# Patient Record
Sex: Female | Born: 1990 | Race: Black or African American | Hispanic: No | Marital: Single | Smoking: Former smoker
Health system: Southern US, Community
[De-identification: ages and names within clinical notes are randomized; demographics above are authoritative.]

## PROBLEM LIST (undated history)

## (undated) DIAGNOSIS — D649 Anemia, unspecified: Secondary | ICD-10-CM

## (undated) DIAGNOSIS — O24419 Gestational diabetes mellitus in pregnancy, unspecified control: Secondary | ICD-10-CM

## (undated) HISTORY — DX: Gestational diabetes mellitus in pregnancy, unspecified control: O24.419

---

## 2011-09-27 HISTORY — PX: DILATION AND CURETTAGE OF UTERUS: SHX78

## 2013-01-10 ENCOUNTER — Emergency Department: Payer: Self-pay | Admitting: Emergency Medicine

## 2019-02-18 ENCOUNTER — Encounter: Payer: Self-pay | Admitting: Emergency Medicine

## 2019-02-18 ENCOUNTER — Other Ambulatory Visit: Payer: Self-pay

## 2019-02-18 ENCOUNTER — Ambulatory Visit
Admission: EM | Admit: 2019-02-18 | Discharge: 2019-02-18 | Disposition: A | Discharge status: Home / Self Care | Payer: Medicaid Other | Attending: Urgent Care | Admitting: Urgent Care

## 2019-02-18 DIAGNOSIS — Z3A01 Less than 8 weeks gestation of pregnancy: Secondary | ICD-10-CM

## 2019-02-18 DIAGNOSIS — O210 Mild hyperemesis gravidarum: Secondary | ICD-10-CM

## 2019-02-18 LAB — PREGNANCY, URINE: Preg Test, Ur: POSITIVE — AB

## 2019-02-18 MED ORDER — ONDANSETRON 4 MG PO TBDP: 4.0000 mg | ORAL_TABLET | Freq: Three times a day (TID) | ORAL | 0 refills | Status: DC | PRN

## 2019-02-18 MED ORDER — ONDANSETRON 8 MG PO TBDP
8.0000 mg | ORAL_TABLET | Freq: Once | ORAL | Status: AC
  Administered 2019-02-18: 12:00:00 8 mg via ORAL

## 2019-02-18 NOTE — Discharge Instructions (Signed)
It was very nice meeting you today in clinic. Thank you for entrusting me with your care.   As discussed, your urine pregnancy test was POSITIVE. You were not having pain or bleeding at time of your visit today. You will need to establish care with OB/GYN to determine fetal age and estimated due dates. I have given you the name of 3 clinics above to call for an appointment. One of them should be able to get you in soon.   Please utilize the medications that we discussed. Your prescriptions have been called in to your pharmacy.  Increase fluid intake as much as possible. Water is always best, as sugar and caffeine containing fluids can cause you to become dehydrated. Try to incorporate electrolyte enriched fluids, such as Gatorade or Pedialyte, into your daily fluid intake.   If your symptoms/condition worsens, please seek follow up care either here or in the ER. Please remember, our [facility] providers are "right here with you" when you need [place].   Again, it was my pleasure to take care of you today. Thank you for choosing our clinic. I hope that you start to feel better quickly.   Quentin Mulling, MSN, APRN, FNP-C, CEN Advanced Practice Provider [facility]

## 2019-02-18 NOTE — ED Provider Notes (Signed)
[address], [address] [place], [place] [address] [phone]   Name: Tammy Townsend DOB: [birth year] MRN: [id number] CSN: [id number] PCP: Patient, No Pcp Per  Arrival date and time:  02/18/19 1117  Chief Complaint:  Emesis; Nausea; and Amenorrhea  NOTE: Prior to seeing the patient today, I have reviewed the triage nursing documentation and vital signs. Clinical staff has updated patient's PMH/PSHx, current medication list, and drug allergies/intolerances to ensure comprehensive history available to assist in medical decision making.   History:   HPI: Tammy Townsend is a 28 y.o. female who presents today with complaints of nausea and vomiting that began 3 days ago. No diarrhea or fevers. Symptoms are in the context of a (+) home urine HCG test. LMP was 01/04/2019 - 01/09/2019. Patient notes that "everything" makes her sick. She states, "I am been trying to eat bland stuff and drink ginger ale, but it is still making me sick. I am so hungry that the hunger is making me sick. Smells are the worst. The least little smell can set me off". Patient feels "weak and dehydrated". She denies abdominal pain, vaginal pain, and bleeding. Patient presents to the clinic today actively vomiting. She was medicated with ondansetron 8 mg ODT in the lobby by clinic nursing staff. Repeat urine HCG in the clinic today found to be (+).   Pregnancy history: Approximately 5-6 week G2P0A1s. Has no OB/GYN provider.   History reviewed. No pertinent past medical history.  Past Surgical History:  Procedure Laterality Date   DILATION AND CURETTAGE OF UTERUS  2013   miscarriage    Family History  Problem Relation Age of Onset   Cancer Mother    Diabetes Father    Hypertension Father    Heart failure Father     Social History   Socioeconomic History   Marital status: Single    Spouse name: Not on file   Number of children: Not on file   Years of education: Not on file   Highest education  level: Not on file  Occupational History   Not on file  Social Needs   Financial resource strain: Not on file   Food insecurity:    Worry: Not on file    Inability: Not on file   Transportation needs:    Medical: Not on file    Non-medical: Not on file  Tobacco Use   Smoking status: Former Smoker   Smokeless tobacco: Never Used  Substance and Sexual Activity   Alcohol use: Not Currently   Drug use: Not Currently   Sexual activity: Not on file  Lifestyle   Physical activity:    Days per week: Not on file    Minutes per session: Not on file   Stress: Not on file  Relationships   Social connections:    Talks on phone: Not on file    Gets together: Not on file    Attends religious service: Not on file    Active member of club or organization: Not on file    Attends meetings of clubs or organizations: Not on file    Relationship status: Not on file   Intimate partner violence:    Fear of current or ex partner: Not on file    Emotionally abused: Not on file    Physically abused: Not on file    Forced sexual activity: Not on file  Other Topics Concern   Not on file  Social History Narrative   Not on file  There are no active problems to display for this patient.   Home Medications:    No outpatient medications have been marked as taking for the 02/18/19 encounter [facility] Encounter).    Allergies:   Patient has no known allergies.  Review of Systems (ROS): Review of Systems  Constitutional: Positive for appetite change (decreased 2/2 acute N/V) and fatigue. Negative for chills and fever.  Respiratory: Negative for cough and shortness of breath.   Cardiovascular: Negative for chest pain and palpitations.  Gastrointestinal: Positive for nausea and vomiting. Negative for abdominal pain, constipation and diarrhea.  Genitourinary: Negative for dysuria, flank pain, hematuria, pelvic pain, urgency, vaginal bleeding, vaginal discharge and vaginal pain.    Musculoskeletal: Negative for back pain.     Physical Exam:  Triage Vital Signs ED Triage Vitals  Enc Vitals Group     BP 02/18/19 1150 (!) 145/80     Pulse Rate 02/18/19 1150 68     Resp 02/18/19 1150 18     Temp 02/18/19 1150 98.4 F (36.9 C)     Temp Source 02/18/19 1150 Oral     SpO2 02/18/19 1150 100 %     Weight 02/18/19 1146 245 lb (111.1 kg)     Height 02/18/19 1146  (1.651 m)     Head Circumference --      Peak Flow --      Pain Score 02/18/19 1145 0     Pain Loc --      Pain Edu? --      Excl. in GC? --     Physical Exam  Constitutional: She is oriented to person, place, and time and well-developed, well-nourished, and in no distress.  HENT:  Head: Normocephalic and atraumatic.  Mouth/Throat: Oropharynx is clear and moist and mucous membranes are normal.  Eyes: Pupils are equal, round, and reactive to light. EOM are normal.  Neck: Normal range of motion. Neck supple. No tracheal deviation present.  Cardiovascular: Normal rate, regular rhythm, normal heart sounds and intact distal pulses. Exam reveals no gallop and no friction rub.  No murmur heard. Pulmonary/Chest: Effort normal and breath sounds normal. No respiratory distress. She has no wheezes. She has no rales.  Abdominal: Soft. Bowel sounds are normal. She exhibits no distension. There is no abdominal tenderness.  Musculoskeletal: Normal range of motion.  Lymphadenopathy:    She has no cervical adenopathy.  Neurological: She is alert and oriented to person, place, and time.  Skin: Skin is warm and dry. No rash noted. No erythema.  Psychiatric: Affect and judgment normal. Her mood appears anxious (2/2 acute symptoms).  Nursing note and vitals reviewed.    Urgent Care Treatments / Results:   LABS: PLEASE NOTE: all labs that were ordered this encounter are listed, however only abnormal results are displayed. Labs Reviewed  PREGNANCY, URINE - Abnormal; Notable for the following components:       Result Value   Preg Test, Ur POSITIVE (*)    All other components within normal limits    EKG: -None  RADIOLOGY: No results found.  PRODEDURES: Procedures  MEDICATIONS RECEIVED THIS VISIT: Medications  ondansetron (ZOFRAN-ODT) disintegrating tablet 8 mg (8 mg Oral Given 02/18/19 1142)    PERTINENT CLINICAL COURSE NOTES/UPDATES: No data to display   Initial Impression / Assessment and Plan / Urgent Care Course:    Tammy Townsend is a 28 y.o. female who presents to [facility] today with complaints of Emesis; Nausea; and Amenorrhea  Pertinent labs &  imaging results that were available during my care of the patient were personally reviewed by me and considered in my medical decision making (see lab/imaging section of note for values and interpretations).  Patient presents with nausea and vomiting x 3 days. Urine HCG (+). Medicated with ondansetron while in the lobby, which markedly improved patient's symptoms. She was observed sipping on clear fluids during exam. VSS at time of my assessment; no tachycardia or hypotension. Patient denies abdominal pain and vaginal bleeding. Based on LMP, patient is approximately 5-[redacted] weeks pregnant. In the absence of ultrasound imaging, there is no way to advise on exact fetal age, to determine that she has a viable IUP at this point, or to provide her with an accurate EDC. Discussed that if she were to develop any abdominal/vaginal pain or bleeding, she would need to be seen in the ED for further evaluation and imaging. Will treat patient for hyperemesis gravidarum; ondansetron prescription sent in. Discussed need to increase fluid intake as much as possible. Reviewed that water always a good choice, but encouraged to incorporate electrolyte enriched fluids, such as Gatorade or Pedialyte, into daily fluid intake. Patient provided with practice contact information for the [facility], [facility], and [facility]. She was  advised to make contact tomorrow to schedule an appointment to establish pre-natal case visit.    Again, I have reviewed the follow up and strict return precautions for any new or worsening symptoms. Patient is aware of symptoms that would be deemed urgent/emergent, and would thus require further evaluation either here or in the emergency department. At the time of discharge, she verbalized understanding and consent with the discharge plan as it was reviewed with her. All questions were fielded by provider and/or clinic staff prior to patient discharge.    Final Clinical Impressions(s) / Urgent Care Diagnoses:   Final diagnoses:  Hyperemesis gravidarum  Less than [redacted] weeks gestation of pregnancy    New Prescriptions:   Meds ordered this encounter  Medications   ondansetron (ZOFRAN-ODT) disintegrating tablet 8 mg   ondansetron (ZOFRAN-ODT) 4 MG disintegrating tablet    Sig: Take 1 tablet (4 mg total) by mouth every 8 (eight) hours as needed for nausea or vomiting.    Dispense:  21 tablet    Refill:  0    Controlled Substance Prescriptions:  [place] Controlled Substance Registry consulted? Not Applicable  NOTE: This note was prepared using Dragon dictation software along with smaller phrase technology. Despite my best ability to proofread, there is the potential that transcriptional errors may still occur from this process, and are completely unintentional.     Verlee MonteGray, Toney Difatta E, NP 02/18/19 1240

## 2019-02-18 NOTE — ED Triage Notes (Signed)
Pt c/o nausea, and vomiting. Started about 3 days ago. She has taken a home pregnancy test and was positive. Last menstrual period was 01/04/19. This is her first pregnancy.

## 2019-03-01 ENCOUNTER — Encounter: Payer: Self-pay | Admitting: Obstetrics and Gynecology

## 2019-03-01 ENCOUNTER — Other Ambulatory Visit: Payer: Self-pay | Admitting: Obstetrics and Gynecology

## 2019-03-01 ENCOUNTER — Observation Stay
Admission: AD | Admit: 2019-03-01 | Discharge: 2019-03-03 | Disposition: A | Discharge status: Home / Self Care | Payer: Medicaid Other | Source: Ambulatory Visit | Attending: Obstetrics and Gynecology | Admitting: Obstetrics and Gynecology

## 2019-03-01 DIAGNOSIS — Z79899 Other long term (current) drug therapy: Secondary | ICD-10-CM | POA: Diagnosis not present

## 2019-03-01 DIAGNOSIS — Z1159 Encounter for screening for other viral diseases: Secondary | ICD-10-CM | POA: Insufficient documentation

## 2019-03-01 DIAGNOSIS — Z87891 Personal history of nicotine dependence: Secondary | ICD-10-CM | POA: Insufficient documentation

## 2019-03-01 DIAGNOSIS — Z8249 Family history of ischemic heart disease and other diseases of the circulatory system: Secondary | ICD-10-CM | POA: Insufficient documentation

## 2019-03-01 DIAGNOSIS — Z3A08 8 weeks gestation of pregnancy: Secondary | ICD-10-CM | POA: Diagnosis not present

## 2019-03-01 DIAGNOSIS — O211 Hyperemesis gravidarum with metabolic disturbance: Principal | ICD-10-CM | POA: Insufficient documentation

## 2019-03-01 DIAGNOSIS — O210 Mild hyperemesis gravidarum: Secondary | ICD-10-CM | POA: Diagnosis present

## 2019-03-01 HISTORY — DX: Anemia, unspecified: D64.9

## 2019-03-01 LAB — COMPREHENSIVE METABOLIC PANEL
ALT: 49 U/L — ABNORMAL HIGH (ref 0–44)
AST: 29 U/L (ref 15–41)
Albumin: 3.7 g/dL (ref 3.5–5.0)
Alkaline Phosphatase: 32 U/L — ABNORMAL LOW (ref 38–126)
Anion gap: 9 (ref 5–15)
BUN: <5 mg/dL — ABNORMAL LOW (ref 6–20)
CO2: 23 mmol/L (ref 22–32)
Calcium: 8.8 mg/dL — ABNORMAL LOW (ref 8.9–10.3)
Chloride: 103 mmol/L (ref 98–111)
Creatinine, Ser: 0.54 mg/dL (ref 0.44–1.00)
GFR calc Af Amer: >60 mL/min (ref >60)
GFR calc non Af Amer: >60 mL/min (ref >60)
Glucose, Bld: 111 mg/dL — ABNORMAL HIGH (ref 70–99)
Potassium: 3.2 mmol/L — ABNORMAL LOW (ref 3.5–5.1)
Sodium: 135 mmol/L (ref 135–145)
Total Bilirubin: 0.9 mg/dL (ref 0.3–1.2)
Total Protein: 7.2 g/dL (ref 6.5–8.1)

## 2019-03-01 LAB — TYPE AND SCREEN
ABO/RH(D): B POS
Antibody Screen: NEGATIVE

## 2019-03-01 LAB — URINALYSIS, ROUTINE W REFLEX MICROSCOPIC
Bacteria, UA: NONE SEEN
Glucose, UA: NEGATIVE mg/dL
Hgb urine dipstick: NEGATIVE
Ketones, ur: 80 mg/dL — AB
Leukocytes,Ua: NEGATIVE
Nitrite: NEGATIVE
Protein, ur: >=300 mg/dL — AB
Specific Gravity, Urine: 1.034 — ABNORMAL HIGH (ref 1.005–1.030)
pH: 6 (ref 5.0–8.0)

## 2019-03-01 LAB — CBC
HCT: 33.8 % — ABNORMAL LOW (ref 36.0–46.0)
Hemoglobin: 11.1 g/dL — ABNORMAL LOW (ref 12.0–15.0)
MCH: 30.6 pg (ref 26.0–34.0)
MCHC: 32.8 g/dL (ref 30.0–36.0)
MCV: 93.1 fL (ref 80.0–100.0)
Platelets: 301 10*3/uL (ref 150–400)
RBC: 3.63 MIL/uL — ABNORMAL LOW (ref 3.87–5.11)
RDW: 12.6 % (ref 11.5–15.5)
WBC: 11.3 10*3/uL — ABNORMAL HIGH (ref 4.0–10.5)
nRBC: 0 % (ref 0.0–0.2)

## 2019-03-01 MED ORDER — CALCIUM CARBONATE ANTACID 500 MG PO CHEW: 2.0000 | CHEWABLE_TABLET | ORAL | Status: DC | PRN

## 2019-03-01 MED ORDER — ACETAMINOPHEN 325 MG PO TABS: 650.0000 mg | ORAL_TABLET | ORAL | Status: DC | PRN

## 2019-03-01 MED ORDER — PROMETHAZINE HCL 25 MG/ML IJ SOLN: 25.0000 mg | Freq: Four times a day (QID) | INTRAMUSCULAR | Status: DC | PRN

## 2019-03-01 MED ORDER — PROMETHAZINE HCL 25 MG/ML IJ SOLN
25.0000 mg | INTRAMUSCULAR | Status: DC | PRN
  Administered 2019-03-01 – 2019-03-02 (×3): 25 mg via INTRAVENOUS
  Filled 2019-03-01 (×3): qty 1

## 2019-03-01 MED ORDER — POTASSIUM CHLORIDE CRYS ER 20 MEQ PO TBCR
20.0000 meq | EXTENDED_RELEASE_TABLET | Freq: Two times a day (BID) | ORAL | Status: DC
  Administered 2019-03-01: 20 meq via ORAL
  Filled 2019-03-01 (×3): qty 1

## 2019-03-01 MED ORDER — GENERIC EXTERNAL MEDICATION
25.00 | Status: DC
Start: 2019-03-01 — End: 2019-03-01

## 2019-03-01 MED ORDER — ZOLPIDEM TARTRATE 5 MG PO TABS: 5.0000 mg | ORAL_TABLET | Freq: Every evening | ORAL | Status: DC | PRN

## 2019-03-01 MED ORDER — DEXTROSE IN LACTATED RINGERS 5 % IV SOLN
INTRAVENOUS | Status: DC
  Administered 2019-03-01 – 2019-03-02 (×2): via INTRAVENOUS

## 2019-03-01 MED ORDER — DOCUSATE SODIUM 100 MG PO CAPS: 100.0000 mg | ORAL_CAPSULE | Freq: Every day | ORAL | Status: DC

## 2019-03-01 MED ORDER — VITAMIN B-6 50 MG PO TABS
25.0000 mg | ORAL_TABLET | Freq: Three times a day (TID) | ORAL | Status: DC | PRN
  Administered 2019-03-03: 25 mg via ORAL
  Filled 2019-03-01 (×2): qty 0.5

## 2019-03-01 NOTE — H&P (Signed)
Tammy Townsend is a 28 y.o. female. She is at [redacted]w[redacted]d. Patient's last menstrual period was 01/03/2019.   Estimated Date of Delivery: 10/10/2019  Prenatal care site: [facility]   Current pregnancy complicated by:  1. Severe intractable N/V x 3 weeks- [facility]   Chief complaint: weakness and unable to tolerate PO intake  Location: nausea and vomiting Onset/timing: about 3 weeks ago Duration: constant Quality: unable to tolerate any PO intake without emesis Severity: n/a Aggravating or alleviating conditions: pregnancy, has tried Diclegis and other PO meds given by ER.  Associated signs/symptoms: weakness, palpitations Context: seen in office today, given IM phenergan 25mg  around 1630  S: Resting comfortably. no CTX, no VB.no LOF. + nausea, + vomiting and dry heaves- improved since IM medication given in office. Denies: HA, visual changes, SOB, or RUQ/epigastric pain  Maternal Medical History:   Past Medical History:  Diagnosis Date  . Anemia     Past Surgical History:  Procedure Laterality Date  . DILATION AND CURETTAGE OF UTERUS  2013   miscarriage    No Known Allergies  Prior to Admission medications   Medication Sig Start Date End Date Taking? Authorizing Provider  ondansetron (ZOFRAN-ODT) 4 MG disintegrating tablet Take 1 tablet (4 mg total) by mouth every 8 (eight) hours as needed for nausea or vomiting. 02/18/19   [name], NP      Social History: She  reports that she has quit smoking. She has never used smokeless tobacco. She reports previous alcohol use. She reports previous drug use.  Family History: family history includes Cancer in her mother; Diabetes in her father; Heart failure in her father; Hypertension in her father.   Review of Systems: A full review of systems was performed and negative except as noted in the HPI.     O:  BP (!) 151/71 (BP Location: Right Arm) Comment: nurse Kelvin Cellar notified  Pulse 68    Temp 98.4 F (36.9 C) (Oral)   Resp 18   LMP 01/03/2019   SpO2 100% Comment: Room Air No results found for this or any previous visit (from the past 48 hour(s)).   Constitutional: NAD, AAOx3  HE/ENT: extraocular movements grossly intact, moist mucous membranes CV: RRR PULM: nl respiratory effort, CTABL     Abd:  non-tender, non-distended, soft      Ext: Non-tender, Nonedematous   Psych: mood appropriate, speech normal Pelvic: deferred  1st tri transvaginal OB US done in office today:  Uterus anteverted Single, viable IUP, S=[redacted]w[redacted]d FHR=170bpm Yolk sac and amnion imaged Cervical length=4.43cm No free fluid seen Rt complex ovarian cyst=2.3cm    A/P: 28 y.o. G2 P0010 at [redacted]w[redacted]d here for Hyperemesis  Principle Diagnosis:  Hyperemesis Gravidarum   IV fluids: D5LR at 140ml/hr  Phenergan IV 25mg  q4hr prn  B6 25mg  PO TID  Clear liquids as tolerated  Labs pending: UA with culture, CMP, type and screen, CBC   Prudencio Pair Corinn Stoltzfus, CNM 03/01/2019  8:25 PM

## 2019-03-02 DIAGNOSIS — O211 Hyperemesis gravidarum with metabolic disturbance: Secondary | ICD-10-CM | POA: Diagnosis not present

## 2019-03-02 LAB — SARS CORONAVIRUS 2 BY RT PCR (HOSPITAL ORDER, PERFORMED IN ~~LOC~~ HOSPITAL LAB): SARS Coronavirus 2: NEGATIVE

## 2019-03-02 MED ORDER — PROMETHAZINE HCL 25 MG PO TABS
25.0000 mg | ORAL_TABLET | ORAL | Status: DC | PRN
  Administered 2019-03-02 – 2019-03-03 (×4): 25 mg via ORAL
  Filled 2019-03-02 (×6): qty 1

## 2019-03-02 MED ORDER — SODIUM CHLORIDE (PF) 0.9 % IJ SOLN
INTRAMUSCULAR | Status: AC
  Administered 2019-03-02: 14:00:00 10 mL
  Filled 2019-03-02: qty 10

## 2019-03-02 MED ORDER — PROMETHAZINE HCL 25 MG/ML IJ SOLN
25.0000 mg | INTRAMUSCULAR | Status: DC | PRN
  Administered 2019-03-02 (×2): 25 mg via INTRAVENOUS
  Filled 2019-03-02 (×2): qty 1

## 2019-03-02 MED ORDER — SUCRALFATE 1 GM/10ML PO SUSP
1.0000 g | Freq: Three times a day (TID) | ORAL | Status: DC
  Administered 2019-03-02 – 2019-03-03 (×4): 1 g via ORAL
  Filled 2019-03-02 (×7): qty 10

## 2019-03-02 MED ORDER — KCL-LACTATED RINGERS-D5W 20 MEQ/L IV SOLN
INTRAVENOUS | Status: DC
  Administered 2019-03-02 – 2019-03-03 (×3): via INTRAVENOUS
  Filled 2019-03-02 (×8): qty 1000

## 2019-03-02 MED ORDER — PROMETHAZINE HCL 25 MG/ML IJ SOLN
25.0000 mg | INTRAMUSCULAR | Status: DC
  Administered 2019-03-02: 09:00:00 25 mg via INTRAVENOUS
  Filled 2019-03-02: qty 1

## 2019-03-02 MED ORDER — THIAMINE HCL 100 MG/ML IJ SOLN
Freq: Once | INTRAVENOUS | Status: AC
  Administered 2019-03-02: 09:00:00 via INTRAVENOUS
  Filled 2019-03-02: qty 1000

## 2019-03-02 MED ORDER — SODIUM CHLORIDE (PF) 0.9 % IJ SOLN
INTRAMUSCULAR | Status: AC
  Administered 2019-03-02: 09:00:00 10 mL
  Filled 2019-03-02: qty 10

## 2019-03-02 NOTE — Progress Notes (Signed)
ANTEPARTUM PROGRESS NOTE  Tammy Townsend is a 28 y.o. G2P0010 at 6751w2d with Estimated Date of Delivery: 10/10/19 who is admitted for Hyperemesis  Length of Stay:  0 Days. Admitted 03/01/2019  Subjective:  Feeling hungry now, last vomiting at 0250 this morning. Pt feels vomiting was due to taking PO KCL.  She reports no uterine contractions, no bleeding and no loss of fluid per vagina. Feeling much better today, not as weak.   Vitals:  BP 134/60 (BP Location: Right Arm)   Pulse 79   Temp 99.1 F (37.3 C) (Oral)   Resp 18   LMP 01/03/2019   SpO2 100% Comment: Room Air  Physical Examination: CONSTITUTIONAL: Well-developed, well-nourished female in no acute distress.  HENT:  Normocephalic, atraumatic, Oropharynx is clear and moist EYES: Conjunctivae and EOM are normal. Pupils are equal, round, and reactive to light. No scleral icterus.  NECK: Normal range of motion, supple, no masses SKIN: Skin is warm and dry. No rash noted. Not diaphoretic. No erythema. No pallor. NEUROLGIC: Alert and oriented to person, place, and time.   PSYCHIATRIC: Normal mood and affect. Normal behavior. Normal judgment and thought content. CARDIOVASCULAR: Normal heart rate noted, regular rhythm RESPIRATORY: Effort normal, no problems with respiration noted MUSCULOSKELETAL: Normal range of motion. No edema and no tenderness. 2+ distal pulses. ABDOMEN: Soft, nontender, nondistended, gravid. CERVIX:  deferred  Results for orders placed or performed during the hospital encounter of 03/01/19 (from the past 48 hour(s))  Urinalysis, Routine w reflex microscopic     Status: Abnormal   Collection Time: 03/01/19  7:34 PM  Result Value Ref Range   Color, Urine AMBER (A) YELLOW    Comment: BIOCHEMICALS MAY BE AFFECTED BY COLOR   APPearance HAZY (A) CLEAR   Specific Gravity, Urine 1.034 (H) 1.005 - 1.030   pH 6.0 5.0 - 8.0   Glucose, UA NEGATIVE NEGATIVE mg/dL   Hgb urine dipstick NEGATIVE NEGATIVE   Bilirubin Urine  SMALL (A) NEGATIVE   Ketones, ur 80 (A) NEGATIVE mg/dL   Protein, ur >=829>=300 (A) NEGATIVE mg/dL   Nitrite NEGATIVE NEGATIVE   Leukocytes,Ua NEGATIVE NEGATIVE   RBC / HPF 0-5 0 - 5 RBC/hpf   WBC, UA 0-5 0 - 5 WBC/hpf   Bacteria, UA NONE SEEN NONE SEEN   Squamous Epithelial / LPF 6-10 0 - 5   Mucus PRESENT    Ca Oxalate Crys, UA PRESENT     Comment: Performed at [facility], [address] [place], [place] [address]  CBC     Status: Abnormal   Collection Time: 03/01/19  9:51 PM  Result Value Ref Range   WBC 11.3 (H) 4.0 - 10.5 K/uL   RBC 3.63 (L) 3.87 - 5.11 MIL/uL   Hemoglobin 11.1 (L) 12.0 - 15.0 g/dL   HCT 08.633.8 (L) 57.836.0 - 46.946.0 %   MCV 93.1 80.0 - 100.0 fL   MCH 30.6 26.0 - 34.0 pg   MCHC 32.8 30.0 - 36.0 g/dL   RDW 62.912.6 52.811.5 - 41.315.5 %   Platelets 301 150 - 400 K/uL   nRBC 0.0 0.0 - 0.2 %    Comment: Performed at [facility], [address] [place], [place] [address]  Comprehensive metabolic panel     Status: Abnormal   Collection Time: 03/01/19  9:51 PM  Result Value Ref Range   Sodium 135 135 - 145 mmol/L   Potassium 3.2 (L) 3.5 - 5.1 mmol/L   Chloride 103 98 - 111  mmol/L   CO2 23 22 - 32 mmol/L   Glucose, Bld 111 (H) 70 - 99 mg/dL   BUN <5 (L) 6 - 20 mg/dL   Creatinine, Ser 0.54 0.44 - 1.00 mg/dL   Calcium 8.8 (L) 8.9 - 10.3 mg/dL   Total Protein 7.2 6.5 - 8.1 g/dL   Albumin 3.7 3.5 - 5.0 g/dL   AST 29 15 - 41 U/L   ALT 49 (H) 0 - 44 U/L   Alkaline Phosphatase 32 (L) 38 - 126 U/L   Total Bilirubin 0.9 0.3 - 1.2 mg/dL   GFR calc non Af Amer >60 >60 mL/min   GFR calc Af Amer >60 >60 mL/min   Anion gap 9 5 - 15    Comment: Performed at [facility], [address] [place], [place] [address]  Type and screen Thomas     Status: None   Collection Time: 03/01/19  9:51 PM  Result Value Ref Range   ABO/RH(D) B POS    Antibody Screen NEG    Sample Expiration      03/04/2019,2359 Performed at [facility], [address], [place], [place] [address]   SARS Coronavirus 2 (CEPHEID - Performed in [facility] hospital lab), Hosp Order     Status: None   Collection Time: 03/02/19 10:18 AM  Result Value Ref Range   SARS Coronavirus 2 NEGATIVE NEGATIVE    Comment: (NOTE) If result is NEGATIVE SARS-CoV-2 target nucleic acids are NOT DETECTED. The SARS-CoV-2 RNA is generally detectable in upper and lower  respiratory specimens during the acute phase of infection. The lowest  concentration of SARS-CoV-2 viral copies this assay can detect is 250  copies / mL. A negative result does not preclude SARS-CoV-2 infection  and should not be used as the sole basis for treatment or other  patient management decisions.  A negative result may occur with  improper specimen collection / handling, submission of specimen other  than nasopharyngeal swab, presence of viral mutation(s) within the  areas targeted by this assay, and inadequate number of viral copies  (<250 copies / mL). A negative result must be combined with clinical  observations, patient history, and epidemiological information. If result is POSITIVE SARS-CoV-2 target nucleic acids are DETECTED. The SARS-CoV-2 RNA is generally detectable in upper and lower  respiratory specimens dur ing the acute phase of infection.  Positive  results are indicative of active infection with SARS-CoV-2.  Clinical  correlation with patient history and other diagnostic information is  necessary to determine patient infection status.  Positive results do  not rule out bacterial infection or co-infection with other viruses. If result is PRESUMPTIVE POSTIVE SARS-CoV-2 nucleic acids MAY BE PRESENT.   A presumptive positive result was obtained on the submitted specimen  and confirmed on repeat testing.  While 2019 novel coronavirus  (SARS-CoV-2) nucleic acids may be present in the submitted sample  additional confirmatory testing may be necessary for  epidemiological  and / or clinical management purposes  to differentiate between  SARS-CoV-2 and other Sarbecovirus currently known to infect humans.  If clinically indicated additional testing with an alternate test  methodology [phone]) is advised. The SARS-CoV-2 RNA is generally  detectable in upper and lower respiratory sp ecimens during the acute  phase of infection. The expected result is Negative. Fact Sheet for Patients:  StrictlyIdeas.no Fact Sheet for Healthcare Providers: BankingDealers.co.za This test is not yet approved or cleared by the [place] FDA and  has been authorized for detection and/or diagnosis of SARS-CoV-2 by FDA under an Emergency Use Authorization (EUA).  This EUA will remain in effect (meaning this test can be used) for the duration of the COVID-19 declaration under Section 564(b)(1) of the Act, 21 U.S.C. section 360bbb-3(b)(1), unless the authorization is terminated or revoked sooner. Performed at [facility], [address] [place], [place] [address]     No results found.  Current scheduled medications . docusate sodium  100 mg Oral Daily  . potassium chloride  20 mEq Oral BID  . sucralfate  1 g Oral TID WC & HS    I have reviewed the patient's current medications.  ASSESSMENT: Patient Active Problem List   Diagnosis Date Noted  . Hyperemesis gravidarum 03/01/2019    PLAN: - Continue routine antenatal care. - continue IV D5LR with 20KCL/liter added; to start after banana bag complete.  - Continue q4h phenergan, will trial PO.  - Pt wishes to try advancing diet, has been tolerating clears well.  - Pt c/o sore throat and heartburn after having several weeks of vomiting, Carafate suspension ordered with meals and qHS.    Randa NgoRebecca A Lunna Vogelgesang, CNM 03/02/2019  1:05 PM

## 2019-03-02 NOTE — Progress Notes (Signed)
VSS.  Pt states that she feels much better than when she was admitted. Tolerated graham crackers, sweet potato, and some clear liquids today.  Patient was willing to try PO Phenergan instead of IV this evening, and has done well so far.  No emesis today.  Pt denies any pain. Reed Breech, RN 03/02/2019 7:35 PM

## 2019-03-03 DIAGNOSIS — O211 Hyperemesis gravidarum with metabolic disturbance: Secondary | ICD-10-CM | POA: Diagnosis not present

## 2019-03-03 LAB — COMPREHENSIVE METABOLIC PANEL
ALT: 71 U/L — ABNORMAL HIGH (ref 0–44)
AST: 32 U/L (ref 15–41)
Albumin: 3.1 g/dL — ABNORMAL LOW (ref 3.5–5.0)
Alkaline Phosphatase: 30 U/L — ABNORMAL LOW (ref 38–126)
Anion gap: 6 (ref 5–15)
BUN: <5 mg/dL — ABNORMAL LOW (ref 6–20)
CO2: 22 mmol/L (ref 22–32)
Calcium: 8.5 mg/dL — ABNORMAL LOW (ref 8.9–10.3)
Chloride: 108 mmol/L (ref 98–111)
Creatinine, Ser: 0.44 mg/dL (ref 0.44–1.00)
GFR calc Af Amer: >60 mL/min (ref >60)
GFR calc non Af Amer: >60 mL/min (ref >60)
Glucose, Bld: 123 mg/dL — ABNORMAL HIGH (ref 70–99)
Potassium: 3.5 mmol/L (ref 3.5–5.1)
Sodium: 136 mmol/L (ref 135–145)
Total Bilirubin: 0.5 mg/dL (ref 0.3–1.2)
Total Protein: 6.3 g/dL — ABNORMAL LOW (ref 6.5–8.1)

## 2019-03-03 LAB — CBC WITH DIFFERENTIAL/PLATELET
Abs Immature Granulocytes: 0.03 10*3/uL (ref 0.00–0.07)
Basophils Absolute: 0 10*3/uL (ref 0.0–0.1)
Basophils Relative: 0 %
Eosinophils Absolute: 0.1 10*3/uL (ref 0.0–0.5)
Eosinophils Relative: 2 %
HCT: 31.7 % — ABNORMAL LOW (ref 36.0–46.0)
Hemoglobin: 10.2 g/dL — ABNORMAL LOW (ref 12.0–15.0)
Immature Granulocytes: 0 %
Lymphocytes Relative: 32 %
Lymphs Abs: 2.3 10*3/uL (ref 0.7–4.0)
MCH: 30.6 pg (ref 26.0–34.0)
MCHC: 32.2 g/dL (ref 30.0–36.0)
MCV: 95.2 fL (ref 80.0–100.0)
Monocytes Absolute: 0.6 10*3/uL (ref 0.1–1.0)
Monocytes Relative: 8 %
Neutro Abs: 4.2 10*3/uL (ref 1.7–7.7)
Neutrophils Relative %: 58 %
Platelets: 284 10*3/uL (ref 150–400)
RBC: 3.33 MIL/uL — ABNORMAL LOW (ref 3.87–5.11)
RDW: 12.7 % (ref 11.5–15.5)
WBC: 7.3 10*3/uL (ref 4.0–10.5)
nRBC: 0 % (ref 0.0–0.2)

## 2019-03-03 LAB — URINE CULTURE: Culture: NO GROWTH

## 2019-03-03 MED ORDER — ACETAMINOPHEN 325 MG PO TABS: 650.0000 mg | ORAL_TABLET | ORAL | Status: AC | PRN

## 2019-03-03 MED ORDER — DOCUSATE SODIUM 100 MG PO CAPS: 100.0000 mg | ORAL_CAPSULE | Freq: Every day | ORAL | 0 refills | Status: AC

## 2019-03-03 MED ORDER — PROMETHAZINE HCL 25 MG RE SUPP: 25.0000 mg | Freq: Four times a day (QID) | RECTAL | 1 refills | Status: DC | PRN

## 2019-03-03 MED ORDER — PYRIDOXINE HCL 25 MG PO TABS: 25.0000 mg | ORAL_TABLET | Freq: Three times a day (TID) | ORAL | 1 refills | Status: DC | PRN

## 2019-03-03 MED ORDER — PROMETHAZINE HCL 25 MG PO TABS: 25.0000 mg | ORAL_TABLET | ORAL | 2 refills | Status: DC | PRN

## 2019-03-03 MED ORDER — SUCRALFATE 1 GM/10ML PO SUSP: 1.0000 g | Freq: Three times a day (TID) | ORAL | 0 refills | Status: DC

## 2019-03-03 MED ORDER — DOXYLAMINE SUCCINATE (SLEEP) 25 MG PO TABS: 12.5000 mg | ORAL_TABLET | Freq: Three times a day (TID) | ORAL | 2 refills | Status: DC

## 2019-03-03 NOTE — Progress Notes (Signed)
Patient discharged to home. Medications/Prescriptions and discharge instructions reviewed with patient who verbalized understanding. Pt discharged with family via W/C. Will schedule follow-up as instructed. 

## 2019-03-03 NOTE — Progress Notes (Signed)
CNM notified of low BP's. No new orders at this time, will be rounding shortly.

## 2019-03-03 NOTE — Discharge Summary (Addendum)
Patient ID: Tammy Townsend MRN: [id number] DOB/AGE: 1990-12-04 28 y.o.  Patient's last menstrual period was 01/03/2019. Estimated Date of Delivery: 10/10/19  Admit date: 03/01/2019 Discharge date: 03/03/2019  Admission Diagnoses: Hyperemesis at [redacted]wks gestation  Discharge Diagnoses: same  Prenatal Procedures: none  Consults: None  Significant Diagnostic Studies:  Results for orders placed or performed during the hospital encounter of 03/01/19 (from the past 168 hour(s))  Culture, Urine   Collection Time: 03/01/19  7:34 PM  Result Value Ref Range   Specimen Description      URINE, RANDOM Performed at [facility], [address] [place], [place] [address]    Special Requests      NONE Performed at [facility], [address] [place], [place] [address]    Culture      NO GROWTH Performed at [facility], [place] [address] [place], [place] [address]    Report Status 03/03/2019 FINAL   Urinalysis, Routine w reflex microscopic   Collection Time: 03/01/19  7:34 PM  Result Value Ref Range   Color, Urine AMBER (A) YELLOW   APPearance HAZY (A) CLEAR   Specific Gravity, Urine 1.034 (H) 1.005 - 1.030   pH 6.0 5.0 - 8.0   Glucose, UA NEGATIVE NEGATIVE mg/dL   Hgb urine dipstick NEGATIVE NEGATIVE   Bilirubin Urine SMALL (A) NEGATIVE   Ketones, ur 80 (A) NEGATIVE mg/dL   Protein, ur >=300 (A) NEGATIVE mg/dL   Nitrite NEGATIVE NEGATIVE   Leukocytes,Ua NEGATIVE NEGATIVE   RBC / HPF 0-5 0 - 5 RBC/hpf   WBC, UA 0-5 0 - 5 WBC/hpf   Bacteria, UA NONE SEEN NONE SEEN   Squamous Epithelial / LPF 6-10 0 - 5   Mucus PRESENT    Ca Oxalate Crys, UA PRESENT   CBC   Collection Time: 03/01/19  9:51 PM  Result Value Ref Range   WBC 11.3 (H) 4.0 - 10.5 K/uL   RBC 3.63 (L) 3.87 - 5.11 MIL/uL   Hemoglobin 11.1 (L) 12.0 - 15.0 g/dL   HCT 33.8 (L) 36.0 - 46.0 %   MCV 93.1 80.0 - 100.0 fL   MCH 30.6 26.0 - 34.0 pg   MCHC 32.8 30.0 - 36.0 g/dL   RDW 12.6 11.5 - 15.5 %    Platelets 301 150 - 400 K/uL   nRBC 0.0 0.0 - 0.2 %  Comprehensive metabolic panel   Collection Time: 03/01/19  9:51 PM  Result Value Ref Range   Sodium 135 135 - 145 mmol/L   Potassium 3.2 (L) 3.5 - 5.1 mmol/L   Chloride 103 98 - 111 mmol/L   CO2 23 22 - 32 mmol/L   Glucose, Bld 111 (H) 70 - 99 mg/dL   BUN <5 (L) 6 - 20 mg/dL   Creatinine, Ser 0.54 0.44 - 1.00 mg/dL   Calcium 8.8 (L) 8.9 - 10.3 mg/dL   Total Protein 7.2 6.5 - 8.1 g/dL   Albumin 3.7 3.5 - 5.0 g/dL   AST 29 15 - 41 U/L   ALT 49 (H) 0 - 44 U/L   Alkaline Phosphatase 32 (L) 38 - 126 U/L   Total Bilirubin 0.9 0.3 - 1.2 mg/dL   GFR calc non Af Amer >60 >60 mL/min   GFR calc Af Amer >60 >60 mL/min   Anion gap 9 5 - 15  Type and screen [place]   Collection Time: 03/01/19  9:51 PM  Result Value Ref Range  ABO/RH(D) B POS    Antibody Screen NEG    Sample Expiration      03/04/2019,2359 Performed at [facility], [address] [place], [place] [address]   SARS Coronavirus 2 (CEPHEID - Performed in [facility]), [facility]   Collection Time: 03/02/19 10:18 AM  Result Value Ref Range   SARS Coronavirus 2 NEGATIVE NEGATIVE  CBC with Differential/Platelet   Collection Time: 03/03/19  5:15 AM  Result Value Ref Range   WBC 7.3 4.0 - 10.5 K/uL   RBC 3.33 (L) 3.87 - 5.11 MIL/uL   Hemoglobin 10.2 (L) 12.0 - 15.0 g/dL   HCT 47.831.7 (L) 29.536.0 - 62.146.0 %   MCV 95.2 80.0 - 100.0 fL   MCH 30.6 26.0 - 34.0 pg   MCHC 32.2 30.0 - 36.0 g/dL   RDW 30.812.7 65.711.5 - 84.615.5 %   Platelets 284 150 - 400 K/uL   nRBC 0.0 0.0 - 0.2 %   Neutrophils Relative % 58 %   Neutro Abs 4.2 1.7 - 7.7 K/uL   Lymphocytes Relative 32 %   Lymphs Abs 2.3 0.7 - 4.0 K/uL   Monocytes Relative 8 %   Monocytes Absolute 0.6 0.1 - 1.0 K/uL   Eosinophils Relative 2 %   Eosinophils Absolute 0.1 0.0 - 0.5 K/uL   Basophils Relative 0 %   Basophils Absolute 0.0 0.0 - 0.1 K/uL   Immature Granulocytes 0 %   Abs Immature  Granulocytes 0.03 0.00 - 0.07 K/uL  Comprehensive metabolic panel   Collection Time: 03/03/19  5:15 AM  Result Value Ref Range   Sodium 136 135 - 145 mmol/L   Potassium 3.5 3.5 - 5.1 mmol/L   Chloride 108 98 - 111 mmol/L   CO2 22 22 - 32 mmol/L   Glucose, Bld 123 (H) 70 - 99 mg/dL   BUN <5 (L) 6 - 20 mg/dL   Creatinine, Ser 9.620.44 0.44 - 1.00 mg/dL   Calcium 8.5 (L) 8.9 - 10.3 mg/dL   Total Protein 6.3 (L) 6.5 - 8.1 g/dL   Albumin 3.1 (L) 3.5 - 5.0 g/dL   AST 32 15 - 41 U/L   ALT 71 (H) 0 - 44 U/L   Alkaline Phosphatase 30 (L) 38 - 126 U/L   Total Bilirubin 0.5 0.3 - 1.2 mg/dL   GFR calc non Af Amer >60 >60 mL/min   GFR calc Af Amer >60 >60 mL/min   Anion gap 6 5 - 15    Treatments: IV hydration and antiemetics  Hospital Course:  This is a 28 y.o. G2P0010 with IUP at 173w3d admitted for Hyperemesis gravidarum. She has had severe intractable N/V x 3 weeks with [facility] She was given IV hydration with D5LR with potassium, and bananabag x 1. Treated acute N/V with IV phenergan then transitioned to PO phenergan. She was able to tolerate regular diet and clear fluids on hospital day 2. DC home meds PO phenergan, vitamin B6 and Unisom, Carafate. She was deemed stable for discharge to home with outpatient follow up.  Discharge Physical Exam:  BP (!) 108/54 (BP Location: Right Arm)   Pulse 79   Temp 98.6 F (37 C) (Oral)   Resp 18   LMP 01/03/2019   SpO2 100% Comment: room air  General: NAD CV: RRR Pulm: CTABL, nl effort ABD: s/nd/nt DVT Evaluation: LE non-ttp, no evidence of DVT on exam.   Discharge Condition: Stable  Disposition: Discharge disposition: 01-Home  or Self Care     Home   Allergies as of 03/03/2019   No Known Allergies     Medication List    STOP taking these medications   ondansetron 4 MG disintegrating tablet Commonly known as:  ZOFRAN-ODT     TAKE these medications   acetaminophen 325 MG tablet Commonly known as:   TYLENOL Take 2 tablets (650 mg total) by mouth every 4 (four) hours as needed (for pain scale < 4  OR  temperature  >/=  100.5 F).   docusate sodium 100 MG capsule Commonly known as:  COLACE Take 1 capsule (100 mg total) by mouth daily. Start taking on:  March 04, 2019   doxylamine (Sleep) 25 MG tablet Commonly known as:  UNISOM Take 0.5 tablets (12.5 mg total) by mouth 3 (three) times daily. Take with vitamin b6   promethazine 25 MG tablet Commonly known as:  PHENERGAN Take 1 tablet (25 mg total) by mouth every 4 (four) hours as needed for nausea or vomiting.   promethazine 25 MG suppository Commonly known as:  Phenergan Place 1 suppository (25 mg total) rectally every 6 (six) hours as needed for nausea.   pyridOXINE 25 MG tablet Commonly known as:  VITAMIN B-6 Take 1 tablet (25 mg total) by mouth 3 (three) times daily as needed (nausea / vomiting).   sucralfate 1 GM/10ML suspension Commonly known as:  CARAFATE Take 10 mLs (1 g total) by mouth 4 (four) times daily -  with meals and at bedtime.      Follow-up Information    Dollene Mallery, Prudencio PairRebecca A, CNM Follow up in 2 week(s).   Specialty:  Obstetrics and Gynecology Why:  New OB appt and f/u labs from hospital Contact information: [address] [place] [address] [phone]           Signed:  Randa NgoRebecca A Zaevion Parke, CNM 03/03/2019  3:23 PM

## 2019-03-03 NOTE — Progress Notes (Signed)
ANTEPARTUM PROGRESS NOTE  Tammy Townsend is a 28 y.o. G2P0010 at 7113w3d with [facility]  10/10/19 who is admitted for Hyperemesis.   Length of Stay:  2 Days. Admitted 03/01/2019  Subjective: Eating light breakfast now, last vomiting around 2200 last night.  She reports no uterine contractions, no bleeding and no loss of fluid per vagina. Feeling much better today, would like to shower.   Vitals:  BP (!) 111/43 (BP Location: Right Arm)   Pulse 61   Temp 97.9 F (36.6 C) (Oral)   Resp 16   LMP 01/03/2019   SpO2 99%   Physical Examination: CONSTITUTIONAL: Well-developed, well-nourished female in no acute distress.  HENT:  Normocephalic, atraumatic, Oropharynx is clear and moist EYES: Conjunctivae and EOM are normal. Pupils are equal, round, and reactive to light. No scleral icterus.  NECK: Normal range of motion, supple, no masses SKIN: Skin is warm and dry. No rash noted. Not diaphoretic. No erythema. No pallor. NEUROLGIC: Alert and oriented to person, place, and time.   PSYCHIATRIC: Normal mood and affect. Normal behavior. Normal judgment and thought content. CARDIOVASCULAR: Normal heart rate noted, regular rhythm RESPIRATORY: Effort normal, no problems with respiration noted MUSCULOSKELETAL: Normal range of motion. No edema and no tenderness. 2+ distal pulses. ABDOMEN: Soft, nontender, nondistended CERVIX:  deferred   Results for orders placed or performed during the hospital encounter of 03/01/19 (from the past 48 hour(s))  Urinalysis, Routine w reflex microscopic     Status: Abnormal   Collection Time: 03/01/19  7:34 PM  Result Value Ref Range   Color, Urine AMBER (A) YELLOW    Comment: BIOCHEMICALS MAY BE AFFECTED BY COLOR   APPearance HAZY (A) CLEAR   Specific Gravity, Urine 1.034 (H) 1.005 - 1.030   pH 6.0 5.0 - 8.0   Glucose, UA NEGATIVE NEGATIVE mg/dL   Hgb urine dipstick NEGATIVE NEGATIVE   Bilirubin Urine SMALL (A) NEGATIVE   Ketones, ur 80 (A) NEGATIVE mg/dL   Protein, ur  >=161>=300 (A) NEGATIVE mg/dL   Nitrite NEGATIVE NEGATIVE   Leukocytes,Ua NEGATIVE NEGATIVE   RBC / HPF 0-5 0 - 5 RBC/hpf   WBC, UA 0-5 0 - 5 WBC/hpf   Bacteria, UA NONE SEEN NONE SEEN   Squamous Epithelial / LPF 6-10 0 - 5   Mucus PRESENT    Ca Oxalate Crys, UA PRESENT     Comment: Performed at [facility], [address] [place], [place] [address]  CBC     Status: Abnormal   Collection Time: 03/01/19  9:51 PM  Result Value Ref Range   WBC 11.3 (H) 4.0 - 10.5 K/uL   RBC 3.63 (L) 3.87 - 5.11 MIL/uL   Hemoglobin 11.1 (L) 12.0 - 15.0 g/dL   HCT 54.033.8 (L) 98.136.0 - 19.146.0 %   MCV 93.1 80.0 - 100.0 fL   MCH 30.6 26.0 - 34.0 pg   MCHC 32.8 30.0 - 36.0 g/dL   RDW 47.812.6 29.511.5 - 62.115.5 %   Platelets 301 150 - 400 K/uL   nRBC 0.0 0.0 - 0.2 %    Comment: Performed at [facility], [address] [place], [place] [address]  Comprehensive metabolic panel     Status: Abnormal   Collection Time: 03/01/19  9:51 PM  Result Value Ref Range   Sodium 135 135 - 145 mmol/L   Potassium 3.2 (L) 3.5 - 5.1 mmol/L   Chloride 103 98 - 111 mmol/L   CO2 23 22 - 32 mmol/L  Glucose, Bld 111 (H) 70 - 99 mg/dL   BUN <5 (L) 6 - 20 mg/dL   Creatinine, Ser 0.54 0.44 - 1.00 mg/dL   Calcium 8.8 (L) 8.9 - 10.3 mg/dL   Total Protein 7.2 6.5 - 8.1 g/dL   Albumin 3.7 3.5 - 5.0 g/dL   AST 29 15 - 41 U/L   ALT 49 (H) 0 - 44 U/L   Alkaline Phosphatase 32 (L) 38 - 126 U/L   Total Bilirubin 0.9 0.3 - 1.2 mg/dL   GFR calc non Af Amer >60 >60 mL/min   GFR calc Af Amer >60 >60 mL/min   Anion gap 9 5 - 15    Comment: Performed at [facility], [address], [place], [place] [address]  Type and screen [place]     Status: None   Collection Time: 03/01/19  9:51 PM  Result Value Ref Range   ABO/RH(D) B POS    Antibody Screen NEG    Sample Expiration      03/04/2019,2359 Performed at [facility], [address] [place], [place] [address]   SARS Coronavirus 2  (CEPHEID - Performed in [place] hospital lab), Hosp Order     Status: None   Collection Time: 03/02/19 10:18 AM  Result Value Ref Range   SARS Coronavirus 2 NEGATIVE NEGATIVE    Comment: (NOTE) If result is NEGATIVE SARS-CoV-2 target nucleic acids are NOT DETECTED. The SARS-CoV-2 RNA is generally detectable in upper and lower  respiratory specimens during the acute phase of infection. The lowest  concentration of SARS-CoV-2 viral copies this assay can detect is 250  copies / mL. A negative result does not preclude SARS-CoV-2 infection  and should not be used as the sole basis for treatment or other  patient management decisions.  A negative result may occur with  improper specimen collection / handling, submission of specimen other  than nasopharyngeal swab, presence of viral mutation(s) within the  areas targeted by this assay, and inadequate number of viral copies  (<250 copies / mL). A negative result must be combined with clinical  observations, patient history, and epidemiological information. If result is POSITIVE SARS-CoV-2 target nucleic acids are DETECTED. The SARS-CoV-2 RNA is generally detectable in upper and lower  respiratory specimens dur ing the acute phase of infection.  Positive  results are indicative of active infection with SARS-CoV-2.  Clinical  correlation with patient history and other diagnostic information is  necessary to determine patient infection status.  Positive results do  not rule out bacterial infection or co-infection with other viruses. If result is PRESUMPTIVE POSTIVE SARS-CoV-2 nucleic acids MAY BE PRESENT.   A presumptive positive result was obtained on the submitted specimen  and confirmed on repeat testing.  While 2019 novel coronavirus  (SARS-CoV-2) nucleic acids may be present in the submitted sample  additional confirmatory testing may be necessary for epidemiological  and / or clinical management purposes  to differentiate between   SARS-CoV-2 and other Sarbecovirus currently known to infect humans.  If clinically indicated additional testing with an alternate test  methodology [phone]) is advised. The SARS-CoV-2 RNA is generally  detectable in upper and lower respiratory sp ecimens during the acute  phase of infection. The expected result is Negative. Fact Sheet for Patients:  StrictlyIdeas.no Fact Sheet for Healthcare Providers: BankingDealers.co.za This test is not yet approved or cleared by the [place] FDA and has been authorized for detection and/or diagnosis of SARS-CoV-2 by FDA  under an Emergency Use Authorization (EUA).  This EUA will remain in effect (meaning this test can be used) for the duration of the COVID-19 declaration under Section 564(b)(1) of the Act, 21 U.S.C. section 360bbb-3(b)(1), unless the authorization is terminated or revoked sooner. Performed at [facility], [address] [place], [place] [address]   CBC with Differential/Platelet     Status: Abnormal   Collection Time: 03/03/19  5:15 AM  Result Value Ref Range   WBC 7.3 4.0 - 10.5 K/uL   RBC 3.33 (L) 3.87 - 5.11 MIL/uL   Hemoglobin 10.2 (L) 12.0 - 15.0 g/dL   HCT 40.931.7 (L) 81.136.0 - 91.446.0 %   MCV 95.2 80.0 - 100.0 fL   MCH 30.6 26.0 - 34.0 pg   MCHC 32.2 30.0 - 36.0 g/dL   RDW 78.212.7 95.611.5 - 21.315.5 %   Platelets 284 150 - 400 K/uL   nRBC 0.0 0.0 - 0.2 %   Neutrophils Relative % 58 %   Neutro Abs 4.2 1.7 - 7.7 K/uL   Lymphocytes Relative 32 %   Lymphs Abs 2.3 0.7 - 4.0 K/uL   Monocytes Relative 8 %   Monocytes Absolute 0.6 0.1 - 1.0 K/uL   Eosinophils Relative 2 %   Eosinophils Absolute 0.1 0.0 - 0.5 K/uL   Basophils Relative 0 %   Basophils Absolute 0.0 0.0 - 0.1 K/uL   Immature Granulocytes 0 %   Abs Immature Granulocytes 0.03 0.00 - 0.07 K/uL    Comment: Performed at [facility], [address] [place], [place] [address]  Comprehensive metabolic  panel     Status: Abnormal   Collection Time: 03/03/19  5:15 AM  Result Value Ref Range   Sodium 136 135 - 145 mmol/L   Potassium 3.5 3.5 - 5.1 mmol/L   Chloride 108 98 - 111 mmol/L   CO2 22 22 - 32 mmol/L   Glucose, Bld 123 (H) 70 - 99 mg/dL   BUN <5 (L) 6 - 20 mg/dL   Creatinine, Ser 8.460.44 0.44 - 1.00 mg/dL   Calcium 8.5 (L) 8.9 - 10.3 mg/dL   Total Protein 6.3 (L) 6.5 - 8.1 g/dL   Albumin 3.1 (L) 3.5 - 5.0 g/dL   AST 32 15 - 41 U/L   ALT 71 (H) 0 - 44 U/L   Alkaline Phosphatase 30 (L) 38 - 126 U/L   Total Bilirubin 0.5 0.3 - 1.2 mg/dL   GFR calc non Af Amer >60 >60 mL/min   GFR calc Af Amer >60 >60 mL/min   Anion gap 6 5 - 15    Comment: Performed at [facility], [address] [place], [place] [address]    No results found.  Current scheduled medications . docusate sodium  100 mg Oral Daily  . potassium chloride  20 mEq Oral BID  . sucralfate  1 g Oral TID WC & HS    I have reviewed the patient's current medications.  ASSESSMENT: Patient Active Problem List   Diagnosis Date Noted  . Hyperemesis gravidarum 03/01/2019    PLAN: - Continue routine antenatal care. - continue to trial PO intake, doing well with light meals and adequate clear liquids.  - Saline lock IV this am, has been tolerating PO Phenergan on schedule.  - improved hypokalemia, still has elevated LFTs. Will need repeat labs at office visit. - Anemia, will need to start PNV/iron supplement  Randa NgoRebecca A , CNM 03/03/2019  9:51 AM

## 2019-03-20 ENCOUNTER — Emergency Department
Admission: EM | Admit: 2019-03-20 | Discharge: 2019-03-20 | Disposition: A | Discharge status: Home / Self Care | Payer: Medicaid Other | Attending: Emergency Medicine | Admitting: Emergency Medicine

## 2019-03-20 DIAGNOSIS — Z3A10 10 weeks gestation of pregnancy: Secondary | ICD-10-CM | POA: Insufficient documentation

## 2019-03-20 DIAGNOSIS — K920 Hematemesis: Secondary | ICD-10-CM | POA: Insufficient documentation

## 2019-03-20 DIAGNOSIS — O219 Vomiting of pregnancy, unspecified: Secondary | ICD-10-CM

## 2019-03-20 DIAGNOSIS — O9989 Other specified diseases and conditions complicating pregnancy, childbirth and the puerperium: Secondary | ICD-10-CM | POA: Insufficient documentation

## 2019-03-20 LAB — COMPREHENSIVE METABOLIC PANEL
ALT: 27 U/L (ref 0–44)
AST: 17 U/L (ref 15–41)
Albumin: 3.6 g/dL (ref 3.5–5.0)
Alkaline Phosphatase: 32 U/L — ABNORMAL LOW (ref 38–126)
Anion gap: 10 (ref 5–15)
BUN: <5 mg/dL — ABNORMAL LOW (ref 6–20)
CO2: 22 mmol/L (ref 22–32)
Calcium: 9.1 mg/dL (ref 8.9–10.3)
Chloride: 103 mmol/L (ref 98–111)
Creatinine, Ser: 0.48 mg/dL (ref 0.44–1.00)
GFR calc Af Amer: >60 mL/min (ref >60)
GFR calc non Af Amer: >60 mL/min (ref >60)
Glucose, Bld: 98 mg/dL (ref 70–99)
Potassium: 3.3 mmol/L — ABNORMAL LOW (ref 3.5–5.1)
Sodium: 135 mmol/L (ref 135–145)
Total Bilirubin: 0.3 mg/dL (ref 0.3–1.2)
Total Protein: 7.6 g/dL (ref 6.5–8.1)

## 2019-03-20 LAB — CBC
HCT: 34.9 % — ABNORMAL LOW (ref 36.0–46.0)
Hemoglobin: 11.5 g/dL — ABNORMAL LOW (ref 12.0–15.0)
MCH: 31 pg (ref 26.0–34.0)
MCHC: 33 g/dL (ref 30.0–36.0)
MCV: 94.1 fL (ref 80.0–100.0)
Platelets: 375 10*3/uL (ref 150–400)
RBC: 3.71 MIL/uL — ABNORMAL LOW (ref 3.87–5.11)
RDW: 13 % (ref 11.5–15.5)
WBC: 8.7 10*3/uL (ref 4.0–10.5)
nRBC: 0 % (ref 0.0–0.2)

## 2019-03-20 LAB — URINALYSIS, COMPLETE (UACMP) WITH MICROSCOPIC
Bacteria, UA: NONE SEEN
Bilirubin Urine: NEGATIVE
Glucose, UA: NEGATIVE mg/dL
Hgb urine dipstick: NEGATIVE
Ketones, ur: NEGATIVE mg/dL
Leukocytes,Ua: NEGATIVE
Nitrite: NEGATIVE
Protein, ur: 30 mg/dL — AB
Specific Gravity, Urine: 1.016 (ref 1.005–1.030)
pH: 7 (ref 5.0–8.0)

## 2019-03-20 LAB — LIPASE, BLOOD: Lipase: 27 U/L (ref 11–51)

## 2019-03-20 MED ORDER — SODIUM CHLORIDE 0.9 % IV BOLUS: 1000.0000 mL | Freq: Once | INTRAVENOUS | Status: DC

## 2019-03-20 MED ORDER — OMEPRAZOLE 20 MG PO CPDR: 20.0000 mg | DELAYED_RELEASE_CAPSULE | Freq: Two times a day (BID) | ORAL | 1 refills | Status: DC

## 2019-03-20 MED ORDER — METOCLOPRAMIDE HCL 10 MG PO TABS
10.0000 mg | ORAL_TABLET | Freq: Once | ORAL | Status: AC
  Administered 2019-03-20: 10 mg via ORAL
  Filled 2019-03-20: qty 1

## 2019-03-20 MED ORDER — DIPHENHYDRAMINE HCL 25 MG PO CAPS
25.0000 mg | ORAL_CAPSULE | Freq: Once | ORAL | Status: AC
  Administered 2019-03-20: 25 mg via ORAL
  Filled 2019-03-20: qty 1

## 2019-03-20 MED ORDER — DOXYLAMINE-PYRIDOXINE 10-10 MG PO TBEC: 20.0000 mg | DELAYED_RELEASE_TABLET | Freq: Every day | ORAL | 0 refills | Status: DC

## 2019-03-20 MED ORDER — PROMETHAZINE HCL 25 MG PO TABS
25.0000 mg | ORAL_TABLET | Freq: Once | ORAL | Status: AC
  Administered 2019-03-20: 25 mg via ORAL
  Filled 2019-03-20: qty 1

## 2019-03-20 NOTE — Discharge Instructions (Addendum)
Follow-up with your regular doctor.  Try the new medication for nausea that you take at bedtime.  See if this helps more than the Phenergan.  Take B6 regularly.  Over-the-counter Benadryl if the B6 and medication are not helping with the nausea.  Return emergency department if you see more blood in your vomit.

## 2019-03-20 NOTE — ED Provider Notes (Signed)
[facility] Emergency Department Provider Note  ____________________________________________   None    (approximate)  I have reviewed the triage vital signs and the nursing notes.   HISTORY  Chief Complaint Emesis    HPI Tammy Townsend is a 28 y.o. female presents emergency department complaining of nausea and vomiting.  Patient is [redacted] weeks pregnant.  She states she is really sick of being in the first trimester she has had vomiting the whole time.  She saw a red chunk of blood earlier today.  No coffee grounds emesis.  She got concerned because of the blood.  She does take Phenergan for nausea but it does not seem to help very much.  She denies any fever chills.  Denies abdominal pain.  Denies diarrhea.  Denies vaginal cramping or bleeding.    Past Medical History:  Diagnosis Date  . Anemia     Patient Active Problem List   Diagnosis Date Noted  . Hyperemesis gravidarum 03/01/2019    Past Surgical History:  Procedure Laterality Date  . DILATION AND CURETTAGE OF UTERUS  2013   miscarriage    Prior to Admission medications   Medication Sig Start Date End Date Taking? Authorizing Provider  acetaminophen (TYLENOL) 325 MG tablet Take 2 tablets (650 mg total) by mouth every 4 (four) hours as needed (for pain scale < 4  OR  temperature  >/=  100.5 F). 03/03/19   McVey, Rebecca A, CNM  docusate sodium (COLACE) 100 MG capsule Take 1 capsule (100 mg total) by mouth daily. 03/04/19   McVey, Prudencio Pairebecca A, CNM  doxylamine, Sleep, (UNISOM) 25 MG tablet Take 0.5 tablets (12.5 mg total) by mouth 3 (three) times daily. Take with vitamin b6 03/03/19   McVey, Prudencio Pairebecca A, CNM  Doxylamine-Pyridoxine 10-10 MG TBEC Take 20 mg by mouth at bedtime. 03/20/19   , Roselyn BeringSusan W, PA-C  omeprazole (PRILOSEC) 20 MG capsule Take 1 capsule (20 mg total) by mouth 2 (two) times daily. 03/20/19 03/19/20  , Roselyn BeringSusan W, PA-C  promethazine (PHENERGAN) 25 MG suppository Place 1 suppository (25  mg total) rectally every 6 (six) hours as needed for nausea. 03/03/19 03/02/20  McVey, Prudencio Pairebecca A, CNM  promethazine (PHENERGAN) 25 MG tablet Take 1 tablet (25 mg total) by mouth every 4 (four) hours as needed for nausea or vomiting. 03/03/19   McVey, Prudencio Pairebecca A, CNM  pyridOXINE (VITAMIN B-6) 25 MG tablet Take 1 tablet (25 mg total) by mouth 3 (three) times daily as needed (nausea / vomiting). 03/03/19   McVey, Prudencio Pairebecca A, CNM  sucralfate (CARAFATE) 1 GM/10ML suspension Take 10 mLs (1 g total) by mouth 4 (four) times daily -  with meals and at bedtime. 03/03/19   McVey, Prudencio Pairebecca A, CNM    Allergies Patient has no known allergies.  Family History  Problem Relation Age of Onset  . Cancer Mother   . Diabetes Father   . Hypertension Father   . Heart failure Father     Social History Social History   Tobacco Use  . Smoking status: Former Games developermoker  . Smokeless tobacco: Never Used  Substance Use Topics  . Alcohol use: Not Currently  . Drug use: Not Currently    Review of Systems  Constitutional: No fever/chills Eyes: No visual changes. ENT: No sore throat. Respiratory: Denies cough Gastrointestinal: Positive for nausea vomiting in pregnancy Genitourinary: Negative for dysuria. Musculoskeletal: Negative for back pain. Skin: Negative for rash.    ____________________________________________   PHYSICAL EXAM:  VITAL SIGNS: ED Triage Vitals  Enc Vitals Group     BP 03/20/19 1130 (!) 145/99     Pulse Rate 03/20/19 1130 81     Resp 03/20/19 1130 16     Temp 03/20/19 1130 98.8 F (37.1 C)     Temp Source 03/20/19 1130 Oral     SpO2 03/20/19 1130 97 %     Weight 03/20/19 1128 246 lb (111.6 kg)     Height 03/20/19 1128 5\' 5"  (1.651 m)     Head Circumference --      Peak Flow --      Pain Score 03/20/19 1128 0     Pain Loc --      Pain Edu? --      Excl. in [place]? --     Constitutional: Alert and oriented. Well appearing and in no acute distress. Eyes: Conjunctivae are normal.  Head:  Atraumatic. Nose: No congestion/rhinnorhea. Mouth/Throat: Mucous membranes are moist.  No blood noted in the throat Neck:  supple no lymphadenopathy noted Cardiovascular: Normal rate, regular rhythm. Heart sounds are normal Respiratory: Normal respiratory effort.  No retractions, lungs c t a  Abd: soft nontender bs normal all 4 quad GU: deferred Musculoskeletal: FROM all extremities, warm and well perfused Neurologic:  Normal speech and language.  Skin:  Skin is warm, dry and intact. No rash noted. Psychiatric: Mood and affect are normal. Speech and behavior are normal.  ____________________________________________   LABS (all labs ordered are listed, but only abnormal results are displayed)  Labs Reviewed  COMPREHENSIVE METABOLIC PANEL - Abnormal; Notable for the following components:      Result Value   Potassium 3.3 (*)    BUN <5 (*)    Alkaline Phosphatase 32 (*)    All other components within normal limits  CBC - Abnormal; Notable for the following components:   RBC 3.71 (*)    Hemoglobin 11.5 (*)    HCT 34.9 (*)    All other components within normal limits  URINALYSIS, COMPLETE (UACMP) WITH MICROSCOPIC - Abnormal; Notable for the following components:   Color, Urine YELLOW (*)    APPearance TURBID (*)    Protein, ur 30 (*)    All other components within normal limits  LIPASE, BLOOD  POC URINE PREG, ED   ____________________________________________   ____________________________________________  RADIOLOGY    ____________________________________________   PROCEDURES  Procedure(s) performed: Reglan 1 p.o., Phenergan 1 p.o., Benadryl 1 p.o.   Procedures    ____________________________________________   INITIAL IMPRESSION / ASSESSMENT AND PLAN / ED COURSE  Pertinent labs & imaging results that were available during my care of the patient were reviewed by me and considered in my medical decision making (see chart for details).   Patient is 28 year old  female who is [redacted] weeks pregnant who is complaining of nausea vomiting.  Patient is concerned that she saw some blood in the vomit this morning.  She is not seeing any additional blood.  Physical exam is basically unremarkable.  Patient appears very well.  Labs for CBC showed mildly decreased H&H typical pregnancy, comprehensive metabolic panel is normal, urinalysis normal, and lipase is negative  Patient does not want IV fluids.  She states she started being poked.  Would like to go home.  Gave her a Phenergan, Reglan, and Benadryl p.o. while here in the ED.  Explained her stay take B6, Benadryl, Tums, or omeprazole due to the nausea.  I also changed her Phenergan to Diclegis.  She  is to follow-up with her [facility].  She states she understands will comply.  I told her if she has more blood in the emesis that she should return emergency department.  She states she understands will comply.  She is discharged stable condition.    Tammy Curtismaris V Arenas was evaluated in Emergency Department on 03/20/2019 for the symptoms described in the history of present illness. She was evaluated in the context of the global COVID-19 pandemic, which necessitated consideration that the patient might be at risk for infection with the SARS-CoV-2 virus that causes COVID-19. Institutional protocols and algorithms that pertain to the evaluation of patients at risk for COVID-19 are in a state of rapid change based on information released by regulatory bodies including the CDC and federal and state organizations. These policies and algorithms were followed during the patient's care in the ED.   As part of my medical decision making, I reviewed the following data within the electronic MEDICAL RECORD NUMBER Nursing notes reviewed and incorporated, Labs reviewed CBC has mildly decreased H&H typical of pregnancy, lipase is normal, comprehensive metabolic panel is normal, urinalysis is normal, Old chart reviewed, Notes from prior ED  visits and [place] Controlled Substance Database  ____________________________________________   FINAL CLINICAL IMPRESSION(S) / ED DIAGNOSES  Final diagnoses:  Nausea and vomiting in pregnancy  Hematemesis with nausea      NEW MEDICATIONS STARTED DURING THIS VISIT:  New Prescriptions   DOXYLAMINE-PYRIDOXINE 10-10 MG TBEC    Take 20 mg by mouth at bedtime.   OMEPRAZOLE (PRILOSEC) 20 MG CAPSULE    Take 1 capsule (20 mg total) by mouth 2 (two) times daily.     Note:  This document was prepared using Dragon voice recognition software and may include unintentional dictation errors.    Faythe GheeFisher,  W, PA-C 03/20/19 1331    Jeanmarie PlantMcShane, James A, MD 03/20/19 [phone]

## 2019-03-20 NOTE — ED Notes (Signed)
Urine was sent to lab NO POC was completed.

## 2019-03-20 NOTE — ED Notes (Signed)
Pt states that she feels she does not need IV fluids, pt requested to speak to provider, provider made aware of request.

## 2019-03-20 NOTE — ED Triage Notes (Signed)
Pt here for vomiting.  Reports has had vomiting whole pregnancy. Only vomited X 1 today but then after she vomited reports she threw up blood.  No diarrhea or fevers.  Reports took her phenergan and it did not improved. G1P0.  [redacted] weeks pregnant. No vomiting yesterday.  When asked how much blood pt states "it was enough to spit up".  Only saw blood X 1.

## 2019-03-26 LAB — OB RESULTS CONSOLE GC/CHLAMYDIA
Chlamydia: POSITIVE
Gonorrhea: NEGATIVE

## 2019-04-02 LAB — OB RESULTS CONSOLE RPR: RPR: NONREACTIVE

## 2019-04-02 LAB — OB RESULTS CONSOLE HEPATITIS B SURFACE ANTIGEN: Hepatitis B Surface Ag: NEGATIVE

## 2019-04-02 LAB — OB RESULTS CONSOLE HIV ANTIBODY (ROUTINE TESTING): HIV: NONREACTIVE

## 2019-04-02 LAB — OB RESULTS CONSOLE RUBELLA ANTIBODY, IGM: Rubella: IMMUNE

## 2019-04-02 LAB — OB RESULTS CONSOLE VARICELLA ZOSTER ANTIBODY, IGG: Varicella: IMMUNE

## 2019-04-17 ENCOUNTER — Encounter: Payer: Self-pay | Admitting: Emergency Medicine

## 2019-04-17 ENCOUNTER — Other Ambulatory Visit: Payer: Self-pay

## 2019-04-17 ENCOUNTER — Emergency Department
Admission: EM | Admit: 2019-04-17 | Discharge: 2019-04-17 | Disposition: A | Discharge status: Home / Self Care | Payer: Medicaid Other | Attending: Student in an Organized Health Care Education/Training Program | Admitting: Student in an Organized Health Care Education/Training Program

## 2019-04-17 DIAGNOSIS — Z87891 Personal history of nicotine dependence: Secondary | ICD-10-CM | POA: Diagnosis not present

## 2019-04-17 DIAGNOSIS — O26892 Other specified pregnancy related conditions, second trimester: Secondary | ICD-10-CM | POA: Insufficient documentation

## 2019-04-17 DIAGNOSIS — K5900 Constipation, unspecified: Secondary | ICD-10-CM | POA: Insufficient documentation

## 2019-04-17 DIAGNOSIS — Z79899 Other long term (current) drug therapy: Secondary | ICD-10-CM | POA: Diagnosis not present

## 2019-04-17 DIAGNOSIS — Z3A15 15 weeks gestation of pregnancy: Secondary | ICD-10-CM | POA: Insufficient documentation

## 2019-04-17 DIAGNOSIS — O210 Mild hyperemesis gravidarum: Secondary | ICD-10-CM | POA: Diagnosis not present

## 2019-04-17 LAB — CBC WITH DIFFERENTIAL/PLATELET
Abs Immature Granulocytes: 0.02 10*3/uL (ref 0.00–0.07)
Basophils Absolute: 0 10*3/uL (ref 0.0–0.1)
Basophils Relative: 0 %
Eosinophils Absolute: 0 10*3/uL (ref 0.0–0.5)
Eosinophils Relative: 0 %
HCT: 35.1 % — ABNORMAL LOW (ref 36.0–46.0)
Hemoglobin: 11.7 g/dL — ABNORMAL LOW (ref 12.0–15.0)
Immature Granulocytes: 0 %
Lymphocytes Relative: 24 %
Lymphs Abs: 2.1 10*3/uL (ref 0.7–4.0)
MCH: 30.9 pg (ref 26.0–34.0)
MCHC: 33.3 g/dL (ref 30.0–36.0)
MCV: 92.6 fL (ref 80.0–100.0)
Monocytes Absolute: 0.6 10*3/uL (ref 0.1–1.0)
Monocytes Relative: 6 %
Neutro Abs: 6.1 10*3/uL (ref 1.7–7.7)
Neutrophils Relative %: 70 %
Platelets: 328 10*3/uL (ref 150–400)
RBC: 3.79 MIL/uL — ABNORMAL LOW (ref 3.87–5.11)
RDW: 11.8 % (ref 11.5–15.5)
WBC: 8.8 10*3/uL (ref 4.0–10.5)
nRBC: 0 % (ref 0.0–0.2)

## 2019-04-17 LAB — COMPREHENSIVE METABOLIC PANEL
ALT: 129 U/L — ABNORMAL HIGH (ref 0–44)
AST: 59 U/L — ABNORMAL HIGH (ref 15–41)
Albumin: 3.8 g/dL (ref 3.5–5.0)
Alkaline Phosphatase: 44 U/L (ref 38–126)
Anion gap: 13 (ref 5–15)
BUN: <5 mg/dL — ABNORMAL LOW (ref 6–20)
CO2: 23 mmol/L (ref 22–32)
Calcium: 9.2 mg/dL (ref 8.9–10.3)
Chloride: 99 mmol/L (ref 98–111)
Creatinine, Ser: 0.38 mg/dL — ABNORMAL LOW (ref 0.44–1.00)
GFR calc Af Amer: >60 mL/min (ref >60)
GFR calc non Af Amer: >60 mL/min (ref >60)
Glucose, Bld: 101 mg/dL — ABNORMAL HIGH (ref 70–99)
Potassium: 3.3 mmol/L — ABNORMAL LOW (ref 3.5–5.1)
Sodium: 135 mmol/L (ref 135–145)
Total Bilirubin: 0.9 mg/dL (ref 0.3–1.2)
Total Protein: 7.9 g/dL (ref 6.5–8.1)

## 2019-04-17 LAB — URINALYSIS, ROUTINE W REFLEX MICROSCOPIC
Bacteria, UA: NONE SEEN
Bilirubin Urine: NEGATIVE
Glucose, UA: NEGATIVE mg/dL
Ketones, ur: 80 mg/dL — AB
Leukocytes,Ua: NEGATIVE
Nitrite: NEGATIVE
Protein, ur: 30 mg/dL — AB
Specific Gravity, Urine: 1.019 (ref 1.005–1.030)
pH: 6 (ref 5.0–8.0)

## 2019-04-17 MED ORDER — SODIUM CHLORIDE 0.9 % IV BOLUS
1000.0000 mL | Freq: Once | INTRAVENOUS | Status: AC
  Administered 2019-04-17: 1000 mL via INTRAVENOUS

## 2019-04-17 MED ORDER — ONDANSETRON 4 MG PO TBDP: 4.0000 mg | ORAL_TABLET | Freq: Three times a day (TID) | ORAL | 0 refills | Status: DC | PRN

## 2019-04-17 MED ORDER — METOCLOPRAMIDE HCL 5 MG/ML IJ SOLN
10.0000 mg | Freq: Once | INTRAMUSCULAR | Status: AC
  Administered 2019-04-17: 10 mg via INTRAVENOUS
  Filled 2019-04-17: qty 2

## 2019-04-17 NOTE — ED Provider Notes (Signed)
[facility] Emergency Department Provider Note ____________________________________________  Time seen: 1805  I have reviewed the triage vital signs and the nursing notes.  HISTORY  Chief Complaint  Weakness and Emesis During Pregnancy  HPI Tammy Townsend is a 28 y.o. female presents herself to the ED for evaluation of emesis during pregnancy.  Patient describes she is not been able to "hold anything down."  She is [redacted] weeks pregnant with a single IUP, currently being followed by [facility].  She was recently given a prescription for Reglan, but has not started the prescription as of yet.  She reports nausea related to most attempts to eat and or drink any food or solids.  She denies any bloody or bilious vomitus at this time.  She also reports some constipation, relieved by a saline.  She reports feeling normal sensations that she describes as flutters in her uterus.  She denies any abnormal vaginal bleeding, fever, chills, pelvic pain, or flank pain.  She presents at the advice of her OB provider, for evaluation and fluid hydration.  Past Medical History:  Diagnosis Date  . Anemia     Patient Active Problem List   Diagnosis Date Noted  . Hyperemesis gravidarum 03/01/2019    Past Surgical History:  Procedure Laterality Date  . DILATION AND CURETTAGE OF UTERUS  2013   miscarriage    Prior to Admission medications   Medication Sig Start Date End Date Taking? Authorizing Provider  acetaminophen (TYLENOL) 325 MG tablet Take 2 tablets (650 mg total) by mouth every 4 (four) hours as needed (for pain scale < 4  OR  temperature  >/=  100.5 F). 03/03/19   McVey, Rebecca A, CNM  docusate sodium (COLACE) 100 MG capsule Take 1 capsule (100 mg total) by mouth daily. 03/04/19   McVey, Murray Hodgkins, CNM  doxylamine, Sleep, (UNISOM) 25 MG tablet Take 0.5 tablets (12.5 mg total) by mouth 3 (three) times daily. Take with vitamin b6 03/03/19   McVey, Murray Hodgkins, CNM  Doxylamine-Pyridoxine  10-10 MG TBEC Take 20 mg by mouth at bedtime. 03/20/19   Fisher, Linden Dolin, PA-C  omeprazole (PRILOSEC) 20 MG capsule Take 1 capsule (20 mg total) by mouth 2 (two) times daily. 03/20/19 03/19/20  Fisher, Linden Dolin, PA-C  ondansetron (ZOFRAN ODT) 4 MG disintegrating tablet Take 1 tablet (4 mg total) by mouth every 8 (eight) hours as needed. 04/17/19   Deondre Marinaro, Dannielle Karvonen, PA-C  promethazine (PHENERGAN) 25 MG suppository Place 1 suppository (25 mg total) rectally every 6 (six) hours as needed for nausea. 03/03/19 03/02/20  McVey, Murray Hodgkins, CNM  promethazine (PHENERGAN) 25 MG tablet Take 1 tablet (25 mg total) by mouth every 4 (four) hours as needed for nausea or vomiting. 03/03/19   McVey, Murray Hodgkins, CNM  pyridOXINE (VITAMIN B-6) 25 MG tablet Take 1 tablet (25 mg total) by mouth 3 (three) times daily as needed (nausea / vomiting). 03/03/19   McVey, Murray Hodgkins, CNM  sucralfate (CARAFATE) 1 GM/10ML suspension Take 10 mLs (1 g total) by mouth 4 (four) times daily -  with meals and at bedtime. 03/03/19   McVey, Murray Hodgkins, CNM    Allergies Patient has no known allergies.  Family History  Problem Relation Age of Onset  . Cancer Mother   . Diabetes Father   . Hypertension Father   . Heart failure Father     Social History Social History   Tobacco Use  . Smoking status: Former Research scientist (life sciences)  .  Smokeless tobacco: Never Used  Substance Use Topics  . Alcohol use: Not Currently  . Drug use: Not Currently    Review of Systems  Constitutional: Negative for fever. Eyes: Negative for visual changes. ENT: Negative for sore throat. Cardiovascular: Negative for chest pain. Respiratory: Negative for shortness of breath. Gastrointestinal: Negative for abdominal pain, and diarrhea. Reports nausea & vomiting. Genitourinary: Negative for dysuria. Musculoskeletal: Negative for back pain. Skin: Negative for rash. Neurological: Negative for headaches, focal weakness or  numbness. ____________________________________________  PHYSICAL EXAM:  VITAL SIGNS: ED Triage Vitals  Enc Vitals Group     BP 04/17/19 1442 137/69     Pulse Rate 04/17/19 1442 81     Resp 04/17/19 1442 18     Temp 04/17/19 1442 98.7 F (37.1 C)     Temp Source 04/17/19 1442 Oral     SpO2 04/17/19 1442 100 %     Weight 04/17/19 1442 242 lb (109.8 kg)     Height 04/17/19 1442 5\' 5"  (1.651 m)     Head Circumference --      Peak Flow --      Pain Score 04/17/19 1449 2     Pain Loc --      Pain Edu? --      Excl. in [place]? --     Constitutional: Alert and oriented. Well appearing and in no distress. Head: Normocephalic and atraumatic. Eyes: Conjunctivae are normal. Normal extraocular movements Cardiovascular: Normal rate, regular rhythm. Normal distal pulses. Grade I systolic murmur noted.  Respiratory: Normal respiratory effort. No wheezes/rales/rhonchi. Gastrointestinal: Soft and nontender. Gravid. Musculoskeletal: Nontender with normal range of motion in all extremities.  Neurologic:  Normal gait without ataxia. Normal speech and language. No gross focal neurologic deficits are appreciated. Skin:  Skin is warm, dry and intact. No rash noted. Psychiatric: Mood and affect are normal. Patient exhibits appropriate insight and judgment. ____________________________________________   LABS (pertinent positives/negatives) Labs Reviewed  CBC WITH DIFFERENTIAL/PLATELET - Abnormal; Notable for the following components:      Result Value   RBC 3.79 (*)    Hemoglobin 11.7 (*)    HCT 35.1 (*)    All other components within normal limits  COMPREHENSIVE METABOLIC PANEL - Abnormal; Notable for the following components:   Potassium 3.3 (*)    Glucose, Bld 101 (*)    BUN <5 (*)    Creatinine, Ser 0.38 (*)    AST 59 (*)    ALT 129 (*)    All other components within normal limits  URINALYSIS, ROUTINE W REFLEX MICROSCOPIC - Abnormal; Notable for the following components:   Color, Urine  AMBER (*)    APPearance HAZY (*)    Hgb urine dipstick SMALL (*)    Ketones, ur 80 (*)    Protein, ur 30 (*)    All other components within normal limits  ____________________________________________  PROCEDURES  Procedures NS 1000 ml bolus IVP Metoclopramide 10 mg IVP ____________________________________________  INITIAL IMPRESSION / ASSESSMENT AND PLAN / ED COURSE  Marsela V Mangham was evaluated in Emergency Department on 04/17/2019 for the symptoms described in the history of present illness. She was evaluated in the context of the global COVID-19 pandemic, which necessitated consideration that the patient might be at risk for infection with the SARS-CoV-2 virus that causes COVID-19. Institutional protocols and algorithms that pertain to the evaluation of patients at risk for COVID-19 are in a state of rapid change based on information released by regulatory bodies including the CDC  and federal and state organizations. These policies and algorithms were followed during the patient's care in the ED.  Patient at 5515 weeks gestational age, presents for hyper emesis gravidarum.  Patient reports continued emesis despite prescriptions for home medications.  She has recently been prescribed Reglan but has not started that prescription as of yet.  Patient's exam is overall benign and reassuring.  Labs are also reassuring as it shows no acute dehydration or anemia.  Patient reports improvement of her symptoms after IV Reglan.  A prescription for Zofran ODT is provided for her relief of emesis.  She is also given instructions on increasing small meals including carb rich foods to help relieve emesis.  She will follow-up with OB provider or return to the ED as needed. ____________________________________________  FINAL CLINICAL IMPRESSION(S) / ED DIAGNOSES  Final diagnoses:  Hyperemesis gravidarum      Matilynn Dacey, Charlesetta IvoryJenise V Bacon, PA-C 04/17/19 2024    Willy Eddyobinson, Patrick, MD 04/17/19 2115

## 2019-04-17 NOTE — ED Triage Notes (Signed)
PT arrives with complaints of inability to "hold down anything." Pt reports she is [redacted] weeks pregnant. Pt states she was instructed to come in for IV hydration. Pt vomiting in triage.

## 2019-04-17 NOTE — Discharge Instructions (Signed)
Your exam and labs are normal today. You have been treated for hyperemesis. Continue to eat small meals and popsicles or ice chips to prevent dehydration. Follow-up with your OB provider, or return as needed.

## 2019-04-20 ENCOUNTER — Encounter: Payer: Self-pay | Admitting: Emergency Medicine

## 2019-04-20 ENCOUNTER — Inpatient Hospital Stay
Admission: EM | Admit: 2019-04-20 | Discharge: 2019-04-22 | DRG: 833 | Disposition: A | Discharge status: Home / Self Care | Payer: Medicaid Other | Attending: Obstetrics and Gynecology | Admitting: Obstetrics and Gynecology

## 2019-04-20 ENCOUNTER — Other Ambulatory Visit: Payer: Self-pay

## 2019-04-20 ENCOUNTER — Inpatient Hospital Stay: Payer: Medicaid Other

## 2019-04-20 DIAGNOSIS — O211 Hyperemesis gravidarum with metabolic disturbance: Secondary | ICD-10-CM | POA: Diagnosis present

## 2019-04-20 DIAGNOSIS — E669 Obesity, unspecified: Secondary | ICD-10-CM | POA: Diagnosis present

## 2019-04-20 DIAGNOSIS — O210 Mild hyperemesis gravidarum: Secondary | ICD-10-CM | POA: Diagnosis present

## 2019-04-20 DIAGNOSIS — Z3A15 15 weeks gestation of pregnancy: Secondary | ICD-10-CM

## 2019-04-20 DIAGNOSIS — O99212 Obesity complicating pregnancy, second trimester: Secondary | ICD-10-CM | POA: Diagnosis present

## 2019-04-20 DIAGNOSIS — Z1159 Encounter for screening for other viral diseases: Secondary | ICD-10-CM | POA: Diagnosis not present

## 2019-04-20 DIAGNOSIS — Z87891 Personal history of nicotine dependence: Secondary | ICD-10-CM

## 2019-04-20 LAB — URINALYSIS, COMPLETE (UACMP) WITH MICROSCOPIC
Glucose, UA: NEGATIVE mg/dL
Ketones, ur: 80 mg/dL — AB
Leukocytes,Ua: NEGATIVE
Nitrite: NEGATIVE
Protein, ur: 100 mg/dL — AB
Specific Gravity, Urine: 1.018 (ref 1.005–1.030)
pH: 6 (ref 5.0–8.0)

## 2019-04-20 LAB — COMPREHENSIVE METABOLIC PANEL
ALT: 165 U/L — ABNORMAL HIGH (ref 0–44)
AST: 72 U/L — ABNORMAL HIGH (ref 15–41)
Albumin: 3.6 g/dL (ref 3.5–5.0)
Alkaline Phosphatase: 40 U/L (ref 38–126)
Anion gap: 12 (ref 5–15)
BUN: <5 mg/dL — ABNORMAL LOW (ref 6–20)
CO2: 24 mmol/L (ref 22–32)
Calcium: 9.5 mg/dL (ref 8.9–10.3)
Chloride: 99 mmol/L (ref 98–111)
Creatinine, Ser: 0.4 mg/dL — ABNORMAL LOW (ref 0.44–1.00)
GFR calc Af Amer: >60 mL/min (ref >60)
GFR calc non Af Amer: >60 mL/min (ref >60)
Glucose, Bld: 97 mg/dL (ref 70–99)
Potassium: 2.8 mmol/L — ABNORMAL LOW (ref 3.5–5.1)
Sodium: 135 mmol/L (ref 135–145)
Total Bilirubin: 1 mg/dL (ref 0.3–1.2)
Total Protein: 7.6 g/dL (ref 6.5–8.1)

## 2019-04-20 LAB — CBC
HCT: 46.8 % — ABNORMAL HIGH (ref 36.0–46.0)
Hemoglobin: 16 g/dL — ABNORMAL HIGH (ref 12.0–15.0)
MCH: 30.6 pg (ref 26.0–34.0)
MCHC: 34.2 g/dL (ref 30.0–36.0)
MCV: 89.5 fL (ref 80.0–100.0)
Platelets: 191 K/uL (ref 150–400)
RBC: 5.23 MIL/uL — ABNORMAL HIGH (ref 3.87–5.11)
RDW: 11.9 % (ref 11.5–15.5)
WBC: 6.3 K/uL (ref 4.0–10.5)
nRBC: 0 % (ref 0.0–0.2)

## 2019-04-20 LAB — WET PREP, GENITAL
Sperm: NONE SEEN
Trich, Wet Prep: NONE SEEN
Yeast Wet Prep HPF POC: NONE SEEN

## 2019-04-20 LAB — SARS CORONAVIRUS 2 BY RT PCR (HOSPITAL ORDER, PERFORMED IN ~~LOC~~ HOSPITAL LAB): SARS Coronavirus 2: NEGATIVE

## 2019-04-20 LAB — LIPASE, BLOOD: Lipase: 27 U/L (ref 11–51)

## 2019-04-20 LAB — CHLAMYDIA/NGC RT PCR (ARMC ONLY)
Chlamydia Tr: NOT DETECTED
N gonorrhoeae: NOT DETECTED

## 2019-04-20 MED ORDER — SODIUM CHLORIDE 0.9% FLUSH
3.0000 mL | Freq: Once | INTRAVENOUS | Status: AC
  Administered 2019-04-21: 3 mL via INTRAVENOUS

## 2019-04-20 MED ORDER — DOCUSATE SODIUM 100 MG PO CAPS
100.0000 mg | ORAL_CAPSULE | Freq: Every day | ORAL | Status: DC
  Administered 2019-04-21 – 2019-04-22 (×2): 100 mg via ORAL
  Filled 2019-04-20 (×2): qty 1

## 2019-04-20 MED ORDER — METOCLOPRAMIDE HCL 5 MG/ML IJ SOLN
10.0000 mg | Freq: Four times a day (QID) | INTRAMUSCULAR | Status: DC
  Administered 2019-04-21 – 2019-04-22 (×6): 10 mg via INTRAVENOUS
  Filled 2019-04-20 (×6): qty 2

## 2019-04-20 MED ORDER — VITAMIN B-6 50 MG PO TABS
25.0000 mg | ORAL_TABLET | Freq: Four times a day (QID) | ORAL | Status: DC
  Administered 2019-04-20 – 2019-04-22 (×5): 25 mg via ORAL
  Filled 2019-04-20 (×9): qty 0.5

## 2019-04-20 MED ORDER — PRENATAL PLUS 27-1 MG PO TABS
1.0000 | ORAL_TABLET | Freq: Every day | ORAL | Status: DC
  Administered 2019-04-21: 1 via ORAL
  Filled 2019-04-20 (×2): qty 1

## 2019-04-20 MED ORDER — ONDANSETRON HCL 4 MG/2ML IJ SOLN: 4.0000 mg | Freq: Once | INTRAMUSCULAR | Status: DC

## 2019-04-20 MED ORDER — ACETAMINOPHEN 325 MG PO TABS: 650.0000 mg | ORAL_TABLET | ORAL | Status: DC | PRN

## 2019-04-20 MED ORDER — ONDANSETRON 4 MG PO TBDP
4.0000 mg | ORAL_TABLET | Freq: Four times a day (QID) | ORAL | Status: DC
  Administered 2019-04-20 – 2019-04-22 (×7): 4 mg via ORAL
  Filled 2019-04-20 (×7): qty 1

## 2019-04-20 MED ORDER — DEXTROSE IN LACTATED RINGERS 5 % IV SOLN: INTRAVENOUS | Status: DC

## 2019-04-20 MED ORDER — PROMETHAZINE HCL 25 MG/ML IJ SOLN
12.5000 mg | Freq: Four times a day (QID) | INTRAMUSCULAR | Status: DC
  Administered 2019-04-20 – 2019-04-22 (×6): 12.5 mg via INTRAVENOUS
  Filled 2019-04-20 (×6): qty 1

## 2019-04-20 MED ORDER — CALCIUM CARBONATE ANTACID 500 MG PO CHEW: 2.0000 | CHEWABLE_TABLET | ORAL | Status: DC | PRN

## 2019-04-20 MED ORDER — METOCLOPRAMIDE HCL 5 MG/ML IJ SOLN
10.0000 mg | Freq: Once | INTRAMUSCULAR | Status: AC
  Administered 2019-04-20: 10 mg via INTRAVENOUS
  Filled 2019-04-20: qty 2

## 2019-04-20 MED ORDER — DEXTROSE IN LACTATED RINGERS 5 % IV SOLN
INTRAVENOUS | Status: DC
  Administered 2019-04-20: 18:00:00 via INTRAVENOUS

## 2019-04-20 MED ORDER — THIAMINE HCL 100 MG/ML IJ SOLN
Freq: Once | INTRAVENOUS | Status: AC
  Administered 2019-04-20: 20:00:00 via INTRAVENOUS
  Filled 2019-04-20: qty 1000

## 2019-04-20 MED ORDER — KCL-LACTATED RINGERS-D5W 20 MEQ/L IV SOLN: INTRAVENOUS | Status: DC

## 2019-04-20 MED ORDER — POTASSIUM CHLORIDE 2 MEQ/ML IV SOLN
INTRAVENOUS | Status: DC
  Administered 2019-04-21 – 2019-04-22 (×5): via INTRAVENOUS
  Filled 2019-04-20 (×9): qty 1000

## 2019-04-20 NOTE — ED Notes (Signed)
IV RN asked for hand warmers. Given heel warmers at this time. Awaiting IV access still.

## 2019-04-20 NOTE — ED Notes (Signed)
Report given to Susan, RN

## 2019-04-20 NOTE — ED Triage Notes (Signed)
Pt to ED stating that she is [redacted] weeks pregnant and that she is not able to hold anything down. Pt states that she was seen earlier this week for same. Pt states that medications she was given are not helping. Pt is in NAD.

## 2019-04-20 NOTE — ED Notes (Signed)
Pt given warm blankets.

## 2019-04-20 NOTE — H&P (Signed)
Tammy Townsend is a 28 y.o. female. She is at [redacted]w[redacted]d. Patient's last menstrual period was 01/03/2019.   Estimated Date of Delivery: 10/10/2019  Prenatal care site: [facility]   Current pregnancy complicated by:  1. Hyperemesis:  multiple visits to ED- [facility] and [facility], weight loss approx 20lbs this preg.   2. Obesity, BMI pre-preg 3. Elevated LFTs 4. Chlamydia and BV, tx'd 6/30  Chief complaint: unable to tolerate PO intake x 1 week without vomiting.   Location: nausea and vomiting Onset/timing: since [redacted]wks GA Duration: constant daily, dry heaves and vomiting Quality: unable to tolerate any PO intake without emesis Severity:  n/a Aggravating or alleviating conditions: pregnancy, has tried Diclegis, zofran, phenergan. Reglan x 10days in June around 8wks.  Associated signs/symptoms: fatigue Context: HG since [redacted]wks GA  S: Resting comfortably. no CTX, no VB.no LOF. + nausea, + vomiting and dry heaves. Mild tenderness RUQ. Denies: HA, visual changes, SOB.   Maternal Medical History:   Past Medical History:  Diagnosis Date  . Anemia     Past Surgical History:  Procedure Laterality Date  . DILATION AND CURETTAGE OF UTERUS  2013   miscarriage    No Known Allergies  Prior to Admission medications   Medication Sig Start Date End Date Taking? Authorizing Provider  ondansetron (ZOFRAN-ODT) 4 MG disintegrating tablet Take 1 tablet (4 mg total) by mouth every 8 (eight) hours as needed for nausea or vomiting. 02/18/19   Karen Kitchens, NP      Social History: She  reports that she has quit smoking. She has never used smokeless tobacco. She reports previous alcohol use. She reports previous drug use.  Family History: family history includes Cancer in her mother; Diabetes in her father; Heart failure in her father; Hypertension in her father.   Review of Systems: A full review of systems was performed and negative except as noted in the HPI.     O:  BP (!) 150/65 (BP Location:  Left Arm)   Pulse 67   Temp 98.4 F (36.9 C) (Oral)   Resp 18   Ht 5\' 5"  (1.651 m)   Wt 104.1 kg   LMP 01/03/2019   SpO2 100%   BMI 38.21 kg/m  Results for orders placed or performed during the hospital encounter of 04/20/19 (from the past 48 hour(s))  Urinalysis, Complete w Microscopic   Collection Time: 04/20/19  1:34 PM  Result Value Ref Range   Color, Urine AMBER (A) YELLOW   APPearance CLOUDY (A) CLEAR   Specific Gravity, Urine 1.018 1.005 - 1.030   pH 6.0 5.0 - 8.0   Glucose, UA NEGATIVE NEGATIVE mg/dL   Hgb urine dipstick SMALL (A) NEGATIVE   Bilirubin Urine SMALL (A) NEGATIVE   Ketones, ur 80 (A) NEGATIVE mg/dL   Protein, ur 100 (A) NEGATIVE mg/dL   Nitrite NEGATIVE NEGATIVE   Leukocytes,Ua NEGATIVE NEGATIVE   RBC / HPF 6-10 0 - 5 RBC/hpf   WBC, UA 6-10 0 - 5 WBC/hpf   Bacteria, UA RARE (A) NONE SEEN   Squamous Epithelial / LPF 6-10 0 - 5   Mucus PRESENT   SARS Coronavirus 2 (CEPHEID - Performed in [place] hospital lab), [facility] Order   Collection Time: 04/20/19  5:04 PM   Specimen: Nasopharyngeal Swab  Result Value Ref Range   SARS Coronavirus 2 NEGATIVE NEGATIVE  CBC   Collection Time: 04/20/19  7:18 PM  Result Value Ref Range   WBC 6.3 4.0 - 10.5  K/uL   RBC 5.23 (H) 3.87 - 5.11 MIL/uL   Hemoglobin 16.0 (H) 12.0 - 15.0 g/dL   HCT 09.846.8 (H) 11.936.0 - 14.746.0 %   MCV 89.5 80.0 - 100.0 fL   MCH 30.6 26.0 - 34.0 pg   MCHC 34.2 30.0 - 36.0 g/dL   RDW 82.911.9 56.211.5 - 13.015.5 %   Platelets 191 150 - 400 K/uL   nRBC 0.0 0.0 - 0.2 %  Comprehensive metabolic panel   Collection Time: 04/20/19  8:46 PM  Result Value Ref Range   Sodium 135 135 - 145 mmol/L   Potassium 2.8 (L) 3.5 - 5.1 mmol/L   Chloride 99 98 - 111 mmol/L   CO2 24 22 - 32 mmol/L   Glucose, Bld 97 70 - 99 mg/dL   BUN <5 (L) 6 - 20 mg/dL   Creatinine, Ser 8.650.40 (L) 0.44 - 1.00 mg/dL   Calcium 9.5 8.9 - 78.410.3 mg/dL   Total Protein 7.6 6.5 - 8.1 g/dL   Albumin 3.6 3.5 - 5.0 g/dL   AST 72 (H) 15 - 41 U/L   ALT  165 (H) 0 - 44 U/L   Alkaline Phosphatase 40 38 - 126 U/L   Total Bilirubin 1.0 0.3 - 1.2 mg/dL   GFR calc non Af Amer >60 >60 mL/min   GFR calc Af Amer >60 >60 mL/min   Anion gap 12 5 - 15  Lipase, blood   Collection Time: 04/20/19  8:46 PM  Result Value Ref Range   Lipase 27 11 - 51 U/L  Wet prep, genital   Collection Time: 04/20/19  9:57 PM   Specimen: Cervical/Vaginal swab; Genital  Result Value Ref Range   Yeast Wet Prep HPF POC NONE SEEN NONE SEEN   Trich, Wet Prep NONE SEEN NONE SEEN   Clue Cells Wet Prep HPF POC PRESENT (A) NONE SEEN   WBC, Wet Prep HPF POC FEW (A) NONE SEEN   Sperm NONE SEEN      RUQ US done today:  Layering stones and sludge within the gallbladder. Mildly prominent common bile duct. Findings are consistent with biliary stasis. No evidence for acute cholecystitis.  Constitutional: NAD, AAOx3  HE/ENT: extraocular movements grossly intact, moist mucous membranes CV: RRR PULM: nl respiratory effort, CTABL     Abd:  non-tender, non-distended, soft      Ext: Non-tender, Nonedematous   Psych: mood appropriate, speech normal Pelvic: deferred  Bedside US performed: SIUP in breech position, active FM and FCA noted.   A/P: 28 y.o. G2 P0010 at 6533w2d here for Hyperemesis  Principle Diagnosis:  Hyperemesis Gravidarum  Hypokalemia with Dehydration:  IV fluids: Banana bag at 27300ml/hr, then LR w/20KCL at 27100ml/hr; due to poor IV access, will not use KCL IVPB until well hydrated  Phenergan IV 12.5mg  q6hr  Zofran 4mg  ODT q6hrs  Reglan 10mg  IV q6hr  B6 25mg  PO QID  Clear liquids and advance as tolerated  Labs pending: wet prep and GC/CT (TOC needed from tx for BV and CT)   Prudencio Pairebecca A Tisha Cline, CNM 04/20/2019  10:40 PM

## 2019-04-20 NOTE — ED Notes (Signed)
IV team at bedside 

## 2019-04-20 NOTE — ED Notes (Signed)
Attempted blood draw. Not successful

## 2019-04-20 NOTE — ED Notes (Signed)
Called [place] and told them pt was ready

## 2019-04-20 NOTE — ED Notes (Signed)
IV team stated she was able to get a 24g in R hand but was unable to obtain any blood.

## 2019-04-20 NOTE — ED Provider Notes (Signed)
[facility] Emergency Department Provider Note       Time seen: ----------------------------------------- 3:46 PM on 04/20/2019 -----------------------------------------   I have reviewed the triage vital signs and the nursing notes.  HISTORY   Chief Complaint Emesis    HPI Tammy Townsend is a 28 y.o. female with a history of anemia who presents to the ED for intractable vomiting.  She is G2, P0 Ab1, is reportedly [redacted] weeks pregnant and cannot keep any thing down.  She has taken Zofran and Phenergan without any improvement.  She was seen here earlier this week for same and the medications are not helping.  She does have some abdominal cramping but denies bleeding.  Patient states she has not eaten anything in a week.  Past Medical History:  Diagnosis Date  . Anemia     Patient Active Problem List   Diagnosis Date Noted  . Hyperemesis gravidarum 03/01/2019    Past Surgical History:  Procedure Laterality Date  . DILATION AND CURETTAGE OF UTERUS  2013   miscarriage    Allergies Patient has no known allergies.  Social History Social History   Tobacco Use  . Smoking status: Former Games developermoker  . Smokeless tobacco: Never Used  Substance Use Topics  . Alcohol use: Not Currently  . Drug use: Not Currently   Review of Systems Constitutional: Negative for fever. Cardiovascular: Negative for chest pain. Respiratory: Negative for shortness of breath. Gastrointestinal: Positive for abdominal cramping, nausea and vomiting Musculoskeletal: Negative for back pain. Skin: Negative for rash. Neurological: Positive for weakness  All systems negative/normal/unremarkable except as stated in the HPI  ____________________________________________   PHYSICAL EXAM:  VITAL SIGNS: ED Triage Vitals  Enc Vitals Group     BP 04/20/19 1322 135/71     Pulse Rate 04/20/19 1322 75     Resp 04/20/19 1322 16     Temp 04/20/19 1322 98.7 F (37.1 C)     Temp Source  04/20/19 1322 Oral     SpO2 04/20/19 1322 96 %     Weight 04/20/19 1324 242 lb (109.8 kg)     Height 04/20/19 1324 5\' 5"  (1.651 m)     Head Circumference --      Peak Flow --      Pain Score 04/20/19 1324 0     Pain Loc --      Pain Edu? --      Excl. in GC? --    Constitutional: Alert and oriented.  Mild distress Eyes: Conjunctivae are normal. Normal extraocular movements. Cardiovascular: Normal rate, regular rhythm. No murmurs, rubs, or gallops. Respiratory: Normal respiratory effort without tachypnea nor retractions. Breath sounds are clear and equal bilaterally. No wheezes/rales/rhonchi. Gastrointestinal: Gravid uterus, nontender.  Normal bowel sounds. Musculoskeletal: Nontender with normal range of motion in extremities. No lower extremity tenderness nor edema. Neurologic:  Normal speech and language. No gross focal neurologic deficits are appreciated.  Skin:  Skin is warm, dry and intact. No rash noted. Psychiatric: Mood and affect are normal. Speech and behavior are normal.  ____________________________________________  ED COURSE:  As part of my medical decision making, I reviewed the following data within the electronic MEDICAL RECORD NUMBER History obtained from family if available, nursing notes, old chart and ekg, as well as notes from prior ED visits. Patient presented for hyperemesis gravidarum, we will assess with labs as indicated at this time.   Procedures  Tammy Townsend was evaluated in Emergency Department on 04/20/2019 for the symptoms described  in the history of present illness. She was evaluated in the context of the global COVID-19 pandemic, which necessitated consideration that the patient might be at risk for infection with the SARS-CoV-2 virus that causes COVID-19. Institutional protocols and algorithms that pertain to the evaluation of patients at risk for COVID-19 are in a state of rapid change based on information released by regulatory bodies including the CDC and  federal and state organizations. These policies and algorithms were followed during the patient's care in the ED.  ____________________________________________   LABS (pertinent positives/negatives)  Labs Reviewed  URINALYSIS, COMPLETE (UACMP) WITH MICROSCOPIC - Abnormal; Notable for the following components:      Result Value   Color, Urine AMBER (*)    APPearance CLOUDY (*)    Hgb urine dipstick SMALL (*)    Bilirubin Urine SMALL (*)    Ketones, ur 80 (*)    Protein, ur 100 (*)    Bacteria, UA RARE (*)    All other components within normal limits  SARS CORONAVIRUS 2 (HOSPITAL ORDER, [place] [facility])  LIPASE, BLOOD  COMPREHENSIVE METABOLIC PANEL  CBC  POC URINE PREG, ED   ____________________________________________   DIFFERENTIAL DIAGNOSIS   Hyperemesis gravidarum, dehydration, electrolyte abnormality  FINAL ASSESSMENT AND PLAN  Hyperemesis gravidarum   Plan: The patient had presented for intractable vomiting. Patient's labs do indicate dehydration and persistent vomiting which she continued to have while in the ER.  I have discussed with her OB/GYN group and in agreement was made to admit her for fluids and antiemetics.  Patient is agreeable to this plan.   Laurence Aly, MD    Note: This note was generated in part or whole with voice recognition software. Voice recognition is usually quite accurate but there are transcription errors that can and very often do occur. I apologize for any typographical errors that were not detected and corrected.     Earleen Newport, MD 04/20/19 [phone]

## 2019-04-20 NOTE — ED Notes (Signed)
US at bedside

## 2019-04-20 NOTE — ED Notes (Signed)
ED TO INPATIENT HANDOFF REPORT  ED Nurse Name and Phone #: Wai Minotti [phone]  S Name/Age/Gender Tammy Townsend 29 y.o. female Room/Bed: ED07A/ED07A  Code Status   Code Status: Full Code  Home/SNF/Other Home Patient oriented to: self, place, time and situation Is this baseline? Yes   Triage Complete: Triage complete  Chief Complaint nausea vomiting  Triage Note Pt to ED stating that she is [redacted] weeks pregnant and that she is not able to hold anything down. Pt states that she was seen earlier this week for same. Pt states that medications she was given are not helping. Pt is in NAD.    Allergies No Known Allergies  Level of Care/Admitting Diagnosis ED Disposition    ED Disposition Condition [facility]: [place] [100120]  Level of Care: Antepartum [20]  Covid Evaluation: Asymptomatic Screening Protocol (No Symptoms)  Diagnosis: Hyperemesis affecting pregnancy, antepartum [5277824]  Admitting Physician: Boykin Nearing [[id number]]  Attending Physician: MCVEY, REBECCA A [[id number]]  Estimated length of stay: past midnight tomorrow  Certification:: I certify this patient will need inpatient services for at least 2 midnights  PT Class (Do Not Modify): Inpatient [101]  PT Acc Code (Do Not Modify): Private [1]       B Medical/Surgery History Past Medical History:  Diagnosis Date  . Anemia    Past Surgical History:  Procedure Laterality Date  . DILATION AND CURETTAGE OF UTERUS  2013   miscarriage     A IV Location/Drains/Wounds Patient Lines/Drains/Airways Status   Active Line/Drains/Airways    None          Intake/Output Last 24 hours No intake or output data in the 24 hours ending 04/20/19 1626  Labs/Imaging Results for orders placed or performed during the hospital encounter of 04/20/19 (from the past 48 hour(s))  Urinalysis, Complete w Microscopic     Status: Abnormal   Collection Time: 04/20/19  1:34 PM  Result  Value Ref Range   Color, Urine AMBER (A) YELLOW    Comment: BIOCHEMICALS MAY BE AFFECTED BY COLOR   APPearance CLOUDY (A) CLEAR   Specific Gravity, Urine 1.018 1.005 - 1.030   pH 6.0 5.0 - 8.0   Glucose, UA NEGATIVE NEGATIVE mg/dL   Hgb urine dipstick SMALL (A) NEGATIVE   Bilirubin Urine SMALL (A) NEGATIVE   Ketones, ur 80 (A) NEGATIVE mg/dL   Protein, ur 100 (A) NEGATIVE mg/dL   Nitrite NEGATIVE NEGATIVE   Leukocytes,Ua NEGATIVE NEGATIVE   RBC / HPF 6-10 0 - 5 RBC/hpf   WBC, UA 6-10 0 - 5 WBC/hpf   Bacteria, UA RARE (A) NONE SEEN   Squamous Epithelial / LPF 6-10 0 - 5   Mucus PRESENT     Comment: Performed at [facility], [address] [place], [place] [address]   No results found.  Pending Labs Unresulted Labs (From admission, onward)    Start     Ordered   04/20/19 1554  SARS Coronavirus 2 (CEPHEID - Performed in [place] lab), [place]  (Asymptomatic Patients Labs)  Once,   STAT    Question:  Rule Out  Answer:  Yes   04/20/19 1553   04/20/19 1326  Lipase, blood  ONCE - STAT,   STAT     04/20/19 1325   04/20/19 1326  Comprehensive metabolic panel  ONCE - STAT,   STAT     04/20/19 1325   04/20/19 1326  CBC  ONCE -  STAT,   STAT     04/20/19 1325          Vitals/Pain Today's Vitals   04/20/19 1322 04/20/19 1324  BP: 135/71   Pulse: 75   Resp: 16   Temp: 98.7 F (37.1 C)   TempSrc: Oral   SpO2: 96%   Weight:  109.8 kg  Height:  5\' 5"  (1.651 m)  PainSc:  0-No pain    Isolation Precautions No active isolations  Medications Medications  sodium chloride flush (NS) 0.9 % injection 3 mL (has no administration in time range)  dextrose 5 % in lactated ringers infusion (has no administration in time range)  metoCLOPramide (REGLAN) injection 10 mg (has no administration in time range)  acetaminophen (TYLENOL) tablet 650 mg (has no administration in time range)  docusate sodium (COLACE) capsule 100 mg (has no administration in time range)   calcium carbonate (TUMS - dosed in mg elemental calcium) chewable tablet 400 mg of elemental calcium (has no administration in time range)  prenatal vitamin w/FE, FA (PRENATAL 1 + 1) 27-1 MG tablet 1 tablet (has no administration in time range)    Mobility walks Low fall risk   Focused Assessments Cardiac Assessment Handoff:    No results found for: CKTOTAL, CKMB, CKMBINDEX, TROPONINI No results found for: DDIMER Does the Patient currently have chest pain? No      R Recommendations: See Admitting Provider Note  Report given to:   Additional Notes:

## 2019-04-21 LAB — CBC WITH DIFFERENTIAL/PLATELET
Abs Immature Granulocytes: 0.02 10*3/uL (ref 0.00–0.07)
Basophils Absolute: 0 10*3/uL (ref 0.0–0.1)
Basophils Relative: 0 %
Eosinophils Absolute: 0.1 10*3/uL (ref 0.0–0.5)
Eosinophils Relative: 1 %
HCT: 28.8 % — ABNORMAL LOW (ref 36.0–46.0)
Hemoglobin: 9.5 g/dL — ABNORMAL LOW (ref 12.0–15.0)
Immature Granulocytes: 0 %
Lymphocytes Relative: 31 %
Lymphs Abs: 2.3 10*3/uL (ref 0.7–4.0)
MCH: 30.8 pg (ref 26.0–34.0)
MCHC: 33 g/dL (ref 30.0–36.0)
MCV: 93.5 fL (ref 80.0–100.0)
Monocytes Absolute: 0.5 10*3/uL (ref 0.1–1.0)
Monocytes Relative: 8 %
Neutro Abs: 4.3 10*3/uL (ref 1.7–7.7)
Neutrophils Relative %: 60 %
Platelets: 249 10*3/uL (ref 150–400)
RBC: 3.08 MIL/uL — ABNORMAL LOW (ref 3.87–5.11)
RDW: 11.9 % (ref 11.5–15.5)
WBC: 7.2 10*3/uL (ref 4.0–10.5)
nRBC: 0 % (ref 0.0–0.2)

## 2019-04-21 LAB — COMPREHENSIVE METABOLIC PANEL
ALT: 122 U/L — ABNORMAL HIGH (ref 0–44)
AST: 58 U/L — ABNORMAL HIGH (ref 15–41)
Albumin: 2.7 g/dL — ABNORMAL LOW (ref 3.5–5.0)
Alkaline Phosphatase: 33 U/L — ABNORMAL LOW (ref 38–126)
Anion gap: 9 (ref 5–15)
BUN: <5 mg/dL — ABNORMAL LOW (ref 6–20)
CO2: 23 mmol/L (ref 22–32)
Calcium: 8.6 mg/dL — ABNORMAL LOW (ref 8.9–10.3)
Chloride: 103 mmol/L (ref 98–111)
Creatinine, Ser: 0.38 mg/dL — ABNORMAL LOW (ref 0.44–1.00)
GFR calc Af Amer: >60 mL/min (ref >60)
GFR calc non Af Amer: >60 mL/min (ref >60)
Glucose, Bld: 124 mg/dL — ABNORMAL HIGH (ref 70–99)
Potassium: 3.1 mmol/L — ABNORMAL LOW (ref 3.5–5.1)
Sodium: 135 mmol/L (ref 135–145)
Total Bilirubin: 0.9 mg/dL (ref 0.3–1.2)
Total Protein: 6 g/dL — ABNORMAL LOW (ref 6.5–8.1)

## 2019-04-21 MED ORDER — METHYLPREDNISOLONE 4 MG PO TABS
16.0000 mg | ORAL_TABLET | Freq: Every day | ORAL | Status: DC
  Administered 2019-04-22: 12:00:00 16 mg via ORAL
  Filled 2019-04-21: qty 4

## 2019-04-21 MED ORDER — METHYLPREDNISOLONE 4 MG PO TABS
16.0000 mg | ORAL_TABLET | Freq: Every day | ORAL | Status: DC
  Filled 2019-04-21: qty 4

## 2019-04-21 MED ORDER — SODIUM CHLORIDE (PF) 0.9 % IJ SOLN
INTRAMUSCULAR | Status: AC
  Administered 2019-04-21: 13:00:00
  Filled 2019-04-21: qty 10

## 2019-04-21 MED ORDER — METHYLPREDNISOLONE 4 MG PO TABS: 4.0000 mg | ORAL_TABLET | Freq: Every day | ORAL | Status: DC

## 2019-04-21 MED ORDER — LACTATED RINGERS IV SOLN: INTRAVENOUS | Status: DC

## 2019-04-21 MED ORDER — SODIUM CHLORIDE (PF) 0.9 % IJ SOLN
INTRAMUSCULAR | Status: AC
  Filled 2019-04-21: qty 10

## 2019-04-21 MED ORDER — METRONIDAZOLE 0.75 % VA GEL
1.0000 | Freq: Every day | VAGINAL | Status: DC
  Administered 2019-04-21: 12:00:00 1 via VAGINAL
  Filled 2019-04-21: qty 70

## 2019-04-21 MED ORDER — METHYLPREDNISOLONE 4 MG PO TABS: 8.0000 mg | ORAL_TABLET | Freq: Every day | ORAL | Status: DC

## 2019-04-21 MED ORDER — METHYLPREDNISOLONE SODIUM SUCC 125 MG IJ SOLR
48.0000 mg | Freq: Once | INTRAMUSCULAR | Status: AC
  Administered 2019-04-21: 09:00:00 48 mg via INTRAVENOUS
  Filled 2019-04-21: qty 2

## 2019-04-21 NOTE — Progress Notes (Signed)
Patient ID: Tammy Townsend, female   DOB: October 19, 1990, 28 y.o.   MRN: [id number] Subjective: Improving nausea . No emesis since admission . Low K+  Objective: Blood pressure (!) 116/53, pulse 65, temperature 98.5 F (36.9 C), temperature source Oral, resp. rate 20, height 5\' 5"  (1.651 m), weight 104.1 kg, last menstrual period 01/03/2019, SpO2 98 %.  Physical Exam:  General: alert and cooperative Abd: soft NT  Uterine Fundus: soft  DVT Evaluation: No evidence of DVT seen on physical exam.  Recent Labs    04/20/19 1918 04/21/19 0656  HGB 16.0* 9.5*  HCT 46.8* 28.8*    Assessment/Plan: Cont IVF with KCl replacement  Start medrol taper . Soft diet prn  Cont antiemetics    LOS: 1 day   Gwen Her Schermerhorn 04/21/2019, 9:41 AM

## 2019-04-21 NOTE — Progress Notes (Signed)
Pt tolerating soft diet.  Excellent intake of food and fluids today with no emesis or complaints of nausea.  Patient reported two episodes of "watery diarrhea" today.  VSS.  Ambulation in hallways with mask encouraged.  Patient states that she feels "hungry and much better" today.  Reed Breech, RN 04/21/2019 6:39 PM

## 2019-04-22 LAB — CBC
HCT: 28.9 % — ABNORMAL LOW (ref 36.0–46.0)
Hemoglobin: 9.5 g/dL — ABNORMAL LOW (ref 12.0–15.0)
MCH: 31.1 pg (ref 26.0–34.0)
MCHC: 32.9 g/dL (ref 30.0–36.0)
MCV: 94.8 fL (ref 80.0–100.0)
Platelets: 273 10*3/uL (ref 150–400)
RBC: 3.05 MIL/uL — ABNORMAL LOW (ref 3.87–5.11)
RDW: 11.9 % (ref 11.5–15.5)
WBC: 11.2 10*3/uL — ABNORMAL HIGH (ref 4.0–10.5)
nRBC: 0 % (ref 0.0–0.2)

## 2019-04-22 LAB — COMPREHENSIVE METABOLIC PANEL
ALT: 99 U/L — ABNORMAL HIGH (ref 0–44)
AST: 36 U/L (ref 15–41)
Albumin: 2.9 g/dL — ABNORMAL LOW (ref 3.5–5.0)
Alkaline Phosphatase: 31 U/L — ABNORMAL LOW (ref 38–126)
Anion gap: 4 — ABNORMAL LOW (ref 5–15)
BUN: <5 mg/dL — ABNORMAL LOW (ref 6–20)
CO2: 24 mmol/L (ref 22–32)
Calcium: 8.7 mg/dL — ABNORMAL LOW (ref 8.9–10.3)
Chloride: 109 mmol/L (ref 98–111)
Creatinine, Ser: 0.34 mg/dL — ABNORMAL LOW (ref 0.44–1.00)
GFR calc Af Amer: >60 mL/min (ref >60)
GFR calc non Af Amer: >60 mL/min (ref >60)
Glucose, Bld: 117 mg/dL — ABNORMAL HIGH (ref 70–99)
Potassium: 3.9 mmol/L (ref 3.5–5.1)
Sodium: 137 mmol/L (ref 135–145)
Total Bilirubin: 0.4 mg/dL (ref 0.3–1.2)
Total Protein: 5.9 g/dL — ABNORMAL LOW (ref 6.5–8.1)

## 2019-04-22 MED ORDER — METHYLPREDNISOLONE 8 MG PO TABS: 8.0000 mg | ORAL_TABLET | Freq: Every day | ORAL | 0 refills | Status: DC

## 2019-04-22 MED ORDER — METHYLPREDNISOLONE 16 MG PO TABS: 16.0000 mg | ORAL_TABLET | Freq: Every day | ORAL | 0 refills | Status: DC

## 2019-04-22 MED ORDER — METHYLPREDNISOLONE 4 MG PO TABS: 4.0000 mg | ORAL_TABLET | Freq: Every day | ORAL | 0 refills | Status: DC

## 2019-04-22 NOTE — Discharge Instructions (Signed)
Hyperemesis Gravidarum °Hyperemesis gravidarum is a severe form of nausea and vomiting that happens during pregnancy. Hyperemesis is worse than morning sickness. It may cause you to have nausea or vomiting all day for many days. It may keep you from eating and drinking enough food and liquids, which can lead to dehydration, malnutrition, and weight loss. Hyperemesis usually occurs during the first half (the first 20 weeks) of pregnancy. It often goes away once a woman is in her second half of pregnancy. However, sometimes hyperemesis continues through an entire pregnancy. °What are the causes? °The cause of this condition is not known. It may be related to changes in chemicals (hormones) in the body during pregnancy, such as the high level of pregnancy hormone (human chorionic gonadotropin) or the increase in the female sex hormone (estrogen). °What are the signs or symptoms? °Symptoms of this condition include: °· Nausea that does not go away. °· Vomiting that does not allow you to keep any food down. °· Weight loss. °· Body fluid loss (dehydration). °· Having no desire to eat, or not liking food that you have previously enjoyed. °How is this diagnosed? °This condition may be diagnosed based on: °· A physical exam. °· Your medical history. °· Your symptoms. °· Blood tests. °· Urine tests. °How is this treated? °This condition is managed by controlling symptoms. This may include: °· Following an eating plan. This can help lessen nausea and vomiting. °· Taking prescription medicines. °An eating plan and medicines are often used together to help control symptoms. If medicines do not help relieve nausea and vomiting, you may need to receive fluids through an IV at the hospital. °Follow these instructions at home: °Eating and drinking ° °· Avoid the following: °? Drinking fluids with meals. Try not to drink anything during the 30 minutes before and after your meals. °? Drinking more than 1 cup of fluid at a  time. °? Eating foods that trigger your symptoms. These may include spicy foods, coffee, high-fat foods, very sweet foods, and acidic foods. °? Skipping meals. Nausea can be more intense on an empty stomach. If you cannot tolerate food, do not force it. Try sucking on ice chips or other frozen items and make up for missed calories later. °? Lying down within 2 hours after eating. °? Being exposed to environmental triggers. These may include food smells, smoky rooms, closed spaces, rooms with strong smells, warm or humid places, overly loud and noisy rooms, and rooms with motion or flickering lights. Try eating meals in a well-ventilated area that is free of strong smells. °? Quick and sudden changes in your movement. °? Taking iron pills and multivitamins that contain iron. If you take prescription iron pills, do not stop taking them unless your health care provider approves. °? Preparing food. The smell of food can spoil your appetite or trigger nausea. °· To help relieve your symptoms: °? Listen to your body. Everyone is different and has different preferences. Find what works best for you. °? Eat and drink slowly. °? Eat 5-6 small meals daily instead of 3 large meals. Eating small meals and snacks can help you avoid an empty stomach. °? In the morning, before getting out of bed, eat a couple of crackers to avoid moving around on an empty stomach. °? Try eating starchy foods as these are usually tolerated well. Examples include cereal, toast, bread, potatoes, pasta, rice, and pretzels. °? Include at least 1 serving of protein with your meals and snacks. Protein options include   lean meats, poultry, seafood, beans, nuts, nut butters, eggs, cheese, and yogurt. °? Try eating a protein-rich snack before bed. Examples of a protein-rick snack include cheese and crackers or a peanut butter sandwich made with 1 slice of whole-wheat bread and 1 tsp (5 g) of peanut butter. °? Eat or suck on things that have ginger in them.  It may help relieve nausea. Add ¼ tsp ground ginger to hot tea or choose ginger tea. °? Try drinking 100% fruit juice or an electrolyte drink. An electrolyte drink contains sodium, potassium, and chloride. °? Drink fluids that are cold, clear, and carbonated or sour. Examples include lemonade, ginger ale, lemon-lime soda, ice water, and sparkling water. °? Brush your teeth or use a mouth rinse after meals. °? Talk with your health care provider about starting a supplement of vitamin B6. °General instructions °· Take over-the-counter and prescription medicines only as told by your health care provider. °· Follow instructions from your health care provider about eating or drinking restrictions. °· Continue to take your prenatal vitamins as told by your health care provider. If you are having trouble taking your prenatal vitamins, talk with your health care provider about different options. °· Keep all follow-up and pre-birth (prenatal) visits as told by your health care provider. This is important. °Contact a health care provider if: °· You have pain in your abdomen. °· You have a severe headache. °· You have vision problems. °· You are losing weight. °· You feel weak or dizzy. °Get help right away if: °· You cannot drink fluids without vomiting. °· You vomit blood. °· You have constant nausea and vomiting. °· You are very weak. °· You faint. °· You have a fever and your symptoms suddenly get worse. °Summary °· Hyperemesis gravidarum is a severe form of nausea and vomiting that happens during pregnancy. °· Making some changes to your eating habits may help relieve nausea and vomiting. °· This condition may be managed with medicine. °· If medicines do not help relieve nausea and vomiting, you may need to receive fluids through an IV at the hospital. °This information is not intended to replace advice given to you by your health care provider. Make sure you discuss any questions you have with your health care  provider. °Document Released: 09/12/2005 Document Revised: 10/02/2017 Document Reviewed: 05/11/2016 °Elsevier Patient Education © 2020 [employer] ° ° °Hyperemesis Gravidarum °Hyperemesis gravidarum is a severe form of nausea and vomiting that happens during pregnancy. Hyperemesis is worse than morning sickness. It may cause you to have nausea or vomiting all day for many days. It may keep you from eating and drinking enough food and liquids, which can lead to dehydration, malnutrition, and weight loss. Hyperemesis usually occurs during the first half (the first 20 weeks) of pregnancy. It often goes away once a woman is in her second half of pregnancy. However, sometimes hyperemesis continues through an entire pregnancy. °What are the causes? °The cause of this condition is not known. It may be related to changes in chemicals (hormones) in the body during pregnancy, such as the high level of pregnancy hormone (human chorionic gonadotropin) or the increase in the female sex hormone (estrogen). °What are the signs or symptoms? °Symptoms of this condition include: °· Nausea that does not go away. °· Vomiting that does not allow you to keep any food down. °· Weight loss. °· Body fluid loss (dehydration). °· Having no desire to eat, or not liking food that you have previously enjoyed. °How   is this diagnosed? °This condition may be diagnosed based on: °· A physical exam. °· Your medical history. °· Your symptoms. °· Blood tests. °· Urine tests. °How is this treated? °This condition is managed by controlling symptoms. This may include: °· Following an eating plan. This can help lessen nausea and vomiting. °· Taking prescription medicines. °An eating plan and medicines are often used together to help control symptoms. If medicines do not help relieve nausea and vomiting, you may need to receive fluids through an IV at the hospital. °Follow these instructions at home: °Eating and drinking ° °· Avoid the  following: °? Drinking fluids with meals. Try not to drink anything during the 30 minutes before and after your meals. °? Drinking more than 1 cup of fluid at a time. °? Eating foods that trigger your symptoms. These may include spicy foods, coffee, high-fat foods, very sweet foods, and acidic foods. °? Skipping meals. Nausea can be more intense on an empty stomach. If you cannot tolerate food, do not force it. Try sucking on ice chips or other frozen items and make up for missed calories later. °? Lying down within 2 hours after eating. °? Being exposed to environmental triggers. These may include food smells, smoky rooms, closed spaces, rooms with strong smells, warm or humid places, overly loud and noisy rooms, and rooms with motion or flickering lights. Try eating meals in a well-ventilated area that is free of strong smells. °? Quick and sudden changes in your movement. °? Taking iron pills and multivitamins that contain iron. If you take prescription iron pills, do not stop taking them unless your health care provider approves. °? Preparing food. The smell of food can spoil your appetite or trigger nausea. °· To help relieve your symptoms: °? Listen to your body. Everyone is different and has different preferences. Find what works best for you. °? Eat and drink slowly. °? Eat 5-6 small meals daily instead of 3 large meals. Eating small meals and snacks can help you avoid an empty stomach. °? In the morning, before getting out of bed, eat a couple of crackers to avoid moving around on an empty stomach. °? Try eating starchy foods as these are usually tolerated well. Examples include cereal, toast, bread, potatoes, pasta, rice, and pretzels. °? Include at least 1 serving of protein with your meals and snacks. Protein options include lean meats, poultry, seafood, beans, nuts, nut butters, eggs, cheese, and yogurt. °? Try eating a protein-rich snack before bed. Examples of a protein-rick snack include cheese and  crackers or a peanut butter sandwich made with 1 slice of whole-wheat bread and 1 tsp (5 g) of peanut butter. °? Eat or suck on things that have ginger in them. It may help relieve nausea. Add ¼ tsp ground ginger to hot tea or choose ginger tea. °? Try drinking 100% fruit juice or an electrolyte drink. An electrolyte drink contains sodium, potassium, and chloride. °? Drink fluids that are cold, clear, and carbonated or sour. Examples include lemonade, ginger ale, lemon-lime soda, ice water, and sparkling water. °? Brush your teeth or use a mouth rinse after meals. °? Talk with your health care provider about starting a supplement of vitamin B6. °General instructions °· Take over-the-counter and prescription medicines only as told by your health care provider. °· Follow instructions from your health care provider about eating or drinking restrictions. °· Continue to take your prenatal vitamins as told by your health care provider. If you are having trouble   taking your prenatal vitamins, talk with your health care provider about different options. °· Keep all follow-up and pre-birth (prenatal) visits as told by your health care provider. This is important. °Contact a health care provider if: °· You have pain in your abdomen. °· You have a severe headache. °· You have vision problems. °· You are losing weight. °· You feel weak or dizzy. °Get help right away if: °· You cannot drink fluids without vomiting. °· You vomit blood. °· You have constant nausea and vomiting. °· You are very weak. °· You faint. °· You have a fever and your symptoms suddenly get worse. °Summary °· Hyperemesis gravidarum is a severe form of nausea and vomiting that happens during pregnancy. °· Making some changes to your eating habits may help relieve nausea and vomiting. °· This condition may be managed with medicine. °· If medicines do not help relieve nausea and vomiting, you may need to receive fluids through an IV at the hospital. °This  information is not intended to replace advice given to you by your health care provider. Make sure you discuss any questions you have with your health care provider. °Document Released: 09/12/2005 Document Revised: 10/02/2017 Document Reviewed: 05/11/2016 °Elsevier Patient Education © 2020 [employer] Inc[employer] ° °

## 2019-04-22 NOTE — Progress Notes (Addendum)
Pt states she is "feeling a lot better" this evening/night. She is tolerating a soft diet and so advanced to regular diet for breakfast (per order).  Drinking lots of fluids and voiding regularly. Eating lots of snacks on this shift. No emesis or complaints of nausea.   VSS. Ambulated in hallway (made several trips around the floor) this evening around 2100.

## 2019-04-22 NOTE — Progress Notes (Signed)
DC to home to car via nursing  staff

## 2019-04-22 NOTE — Discharge Summary (Addendum)
Patient ID: Tammy Townsend, female   DOB: Mar 02, 1991, 28 y.o.   MRN: [id number] Subjective: Tolerating po and ready for discharge  Objective: Blood pressure (!) 116/53, pulse 65, temperature 98.5 F (36.9 C), temperature source Oral, resp. rate 20, height 5\' 5"  (1.651 m), weight 104.1 kg, last menstrual period 01/03/2019, SpO2 98 %.  Physical Exam:  General: alert and cooperative Abd: soft NT   DVT Evaluation: No evidence of DVT seen on physical exam.  Recent Labs    04/20/19 1918 04/21/19 0656  HGB 16.0* 9.5*  HCT 46.8* 28.8*    Assessment/Plan:  S/p treatment for hyperemesis, now resolved S/p hypokalemia: now resolved Pt confirms that she requests bilateral tubal sterilization after delivery  D/c home with antiemetics, medrol dose pack, behavioral mod F/u tomorrow in clinic as scheduled.  Benjaman Kindler 04/22/19

## 2019-04-22 NOTE — Progress Notes (Signed)
DC teaching completed.  Verb u/o of home care and f/u

## 2019-07-19 ENCOUNTER — Encounter: Payer: Self-pay | Admitting: *Deleted

## 2019-07-19 ENCOUNTER — Encounter: Payer: Medicaid Other | Attending: Obstetrics and Gynecology | Admitting: *Deleted

## 2019-07-19 ENCOUNTER — Other Ambulatory Visit: Payer: Self-pay

## 2019-07-19 VITALS — BP 126/70 | Ht 65.0 in | Wt 264.3 lb

## 2019-07-19 DIAGNOSIS — O24419 Gestational diabetes mellitus in pregnancy, unspecified control: Secondary | ICD-10-CM | POA: Diagnosis not present

## 2019-07-19 DIAGNOSIS — Z3A00 Weeks of gestation of pregnancy not specified: Secondary | ICD-10-CM | POA: Diagnosis not present

## 2019-07-19 DIAGNOSIS — O24410 Gestational diabetes mellitus in pregnancy, diet controlled: Secondary | ICD-10-CM

## 2019-07-19 NOTE — Patient Instructions (Signed)
Read booklet on Gestational Diabetes Follow Gestational Meal Planning Guidelines Avoid cold cereal for breakfast Continue to avoid fruit juices Complete a 3 Day Food Record and bring to next appointment Check blood sugars 4 x day - before breakfast and 2 hrs after every meal and record  Bring blood sugar log to all appointments Call MD for prescription for meter strips and lancets Strips  Accu-Chek Guide Lancets   Accu-Chek FastClix Purchase urine ketone strips if ordered by MD and check urine ketones every am:  If + increase bedtime snack to 1 protein and 2 carbohydrate servings Walk 20-30 minutes at least 5 x week if permitted by MD

## 2019-07-19 NOTE — Progress Notes (Signed)
Diabetes Self-Management Education  Visit Type: First/Initial  Appt. Start Time: 1325 Appt. End Time: 1500  07/19/2019  [name], identified by name and date of birth, is a 28 y.o. female with a diagnosis of Diabetes: Gestational Diabetes.   ASSESSMENT  Blood pressure 126/70, height 5\' 5"  (1.651 m), weight 264 lb 4.8 oz (119.9 kg), last menstrual period 01/03/2019, estimated date of delivery 10/10/2019 Body mass index is 43.98 kg/m.  Diabetes Self-Management Education - 07/19/19 1525      Visit Information   Visit Type  First/Initial      Initial Visit   Diabetes Type  Gestational Diabetes    Are you currently following a meal plan?  Yes    What type of meal plan do you follow?  "trying to watch sugars and eat more healthy"    Are you taking your medications as prescribed?  Yes    Date Diagnosed  this month      Health Coping   How would you rate your overall health?  Good      Psychosocial Assessment   Patient Belief/Attitude about Diabetes  Motivated to manage diabetes    Self-care barriers  None    Self-management support  Doctor's office;Friends    Patient Concerns  Nutrition/Meal planning;Glycemic Control;Weight Control;Monitoring;Healthy Lifestyle    Special Needs  None    Preferred Learning Style  Auditory;Visual;Hands on    [place] in progress    How often do you need to have someone help you when you read instructions, pamphlets, or other written materials from your doctor or pharmacy?  1 - Never    What is the last grade level you completed in school?  college      Pre-Education Assessment   Patient understands the diabetes disease and treatment process.  Needs Instruction    Patient understands incorporating nutritional management into lifestyle.  Needs Instruction    Patient undertands incorporating physical activity into lifestyle.  Needs Instruction    Patient understands using medications safely.  Needs Instruction    Patient  understands monitoring blood glucose, interpreting and using results  Needs Instruction    Patient understands prevention, detection, and treatment of acute complications.  Needs Instruction    Patient understands prevention, detection, and treatment of chronic complications.  Needs Instruction    Patient understands how to develop strategies to address psychosocial issues.  Needs Instruction    Patient understands how to develop strategies to promote health/change behavior.  Needs Instruction      Complications   How often do you check your blood sugar?  0 times/day (not testing)   Provided Accu-Chek Guide Me meter and instructed on use. BG upon return demonstration was 69 mg/dL at 2:50 pm- 6 hrs pp. Instructed pt to eat when she leaves appointment.   Have you had a dilated eye exam in the past 12 months?  No    Have you had a dental exam in the past 12 months?  No    Are you checking your feet?  No      Dietary Intake   Breakfast  oatmeal or cereal and milk - was eating pancakes, egg whites with bacon and cheese    Lunch  chicken cesaer salad or [place] sandwich on wheat bread    Snack (afternoon)  apple    Dinner  chicken, salmon with potatoes, peas, green beans, occasional corn, lima beans, rice, occasional pasta, spinach, carrots, cuccumbers, broccoli    Beverage(s)  water  Exercise   Exercise Type  Light (walking / raking leaves)    How many days per week to you exercise?  7    How many minutes per day do you exercise?  30    Total minutes per week of exercise  210      Patient Education   Previous Diabetes Education  No    Disease state   Definition of diabetes, type 1 and 2, and the diagnosis of diabetes;Factors that contribute to the development of diabetes    Nutrition management   Role of diet in the treatment of diabetes and the relationship between the three main macronutrients and blood glucose level;Food label reading, portion sizes and measuring food.;Reviewed blood  glucose goals for pre and post meals and how to evaluate the patients' food intake on their blood glucose level.    Physical activity and exercise   Role of exercise on diabetes management, blood pressure control and cardiac health.    Medications  Other (comment)   Use of oral medications and insulin during pregnancy   Chronic complications  Relationship between chronic complications and blood glucose control    Psychosocial adjustment  Identified and addressed patients feelings and concerns about diabetes;Role of stress on diabetes    Preconception care  Pregnancy and GDM  Role of pre-pregnancy blood glucose control on the development of the fetus;Reviewed with patient blood glucose goals with pregnancy;Role of family planning for patients with diabetes      Individualized Goals (developed by patient)   Reducing Risk  Improve blood sugars Prevent diabetes complications Lose weight Lead a healthier lifestyle     Outcomes   Expected Outcomes  Demonstrated interest in learning. Expect positive outcomes    Future DMSE  2 wks       Individualized Plan for Diabetes Self-Management Training:   Learning Objective:  Patient will have a greater understanding of diabetes self-management. Patient education plan is to attend individual and/or group sessions per assessed needs and concerns.   Plan:   Patient Instructions  Read booklet on Gestational Diabetes Follow Gestational Meal Planning Guidelines Avoid cold cereal for breakfast Continue to avoid fruit juices Complete a 3 Day Food Record and bring to next appointment Check blood sugars 4 x day - before breakfast and 2 hrs after every meal and record  Bring blood sugar log to all appointments Call MD for prescription for meter strips and lancets Strips  Accu-Chek Guide Lancets   Accu-Chek FastClix Purchase urine ketone strips if ordered by MD and check urine ketones every am:  If + increase bedtime snack to 1 protein and 2 carbohydrate  servings Walk 20-30 minutes at least 5 x week if permitted by MD  Expected Outcomes:  Demonstrated interest in learning. Expect positive outcomes  Education material provided:  Gestational Booklet Gestational Meal Planning Guidelines Simple Meal Plan Viewed Gestational Diabetes Video Meter = Accu-Chek Guide Me 3 Day Food Record Goals for a Healthy Pregnancy  If problems or questions, patient to contact team via:  Sharion Settler, RN, CCM, CDE [phone]  Future DSME appointment: 2 wks  July 31, 2019 with the dietitian

## 2019-07-31 ENCOUNTER — Encounter: Payer: Medicaid Other | Attending: Obstetrics and Gynecology | Admitting: Skilled Nursing Facility1

## 2019-07-31 ENCOUNTER — Other Ambulatory Visit: Payer: Self-pay

## 2019-07-31 DIAGNOSIS — O24419 Gestational diabetes mellitus in pregnancy, unspecified control: Secondary | ICD-10-CM | POA: Diagnosis present

## 2019-07-31 DIAGNOSIS — Z3A00 Weeks of gestation of pregnancy not specified: Secondary | ICD-10-CM | POA: Insufficient documentation

## 2019-07-31 NOTE — Progress Notes (Addendum)
Pt states she is not as nervous as she was from her last appointment. Pt states she has been checking her blood sugars 4 times a day every day.  Fasting 84-94; 85-107 Pt states she has sweet cravings all the time and constantly has to fight them. Pt states she pays close attention to what she is eating and what her blood sugars are form what she has eaten. Pt states because of her gallbladder she cannot handle high fat foods.  Pt states she notices when she does not take her zofran her sugars are lower.  Pt states she really does not eat nor does she like non starchy vegetables.   Pt is doing an excellent job of identifying her blood sugars and changing her food choices based on her numbers.  Goals: Try greek yogurt bar instead of fudge bar Try chocolate muscle milk for sweet cravings Aim to have non starchy vegetables 7 days a week: start with raw carrot and ranch   Refer to your list for balanced meal ideas Read your labels looking for grams of carbohydrate 15 being 1 serving  On nasty days do your work out videos  Under 60 is low, correct it with sugar and check it again in 15 minutes later Handout given: myplate with meal ideas and list of food categories with options

## 2019-09-23 ENCOUNTER — Encounter: Payer: Self-pay | Admitting: Obstetrics and Gynecology

## 2019-09-23 ENCOUNTER — Inpatient Hospital Stay: Payer: Medicaid Other | Admitting: Anesthesiology

## 2019-09-23 ENCOUNTER — Other Ambulatory Visit: Payer: Self-pay | Admitting: Obstetrics and Gynecology

## 2019-09-23 ENCOUNTER — Inpatient Hospital Stay
Admission: RE | Admit: 2019-09-23 | Discharge: 2019-09-26 | DRG: 784 | Disposition: A | Discharge status: Home / Self Care | Payer: Medicaid Other | Attending: Obstetrics and Gynecology | Admitting: Obstetrics and Gynecology

## 2019-09-23 ENCOUNTER — Other Ambulatory Visit: Payer: Self-pay

## 2019-09-23 ENCOUNTER — Encounter
Admission: RE | Disposition: A | Discharge status: Home / Self Care | Payer: Self-pay | Source: Home / Self Care | Attending: Obstetrics and Gynecology

## 2019-09-23 DIAGNOSIS — Z3A37 37 weeks gestation of pregnancy: Secondary | ICD-10-CM | POA: Diagnosis not present

## 2019-09-23 DIAGNOSIS — Z20828 Contact with and (suspected) exposure to other viral communicable diseases: Secondary | ICD-10-CM | POA: Diagnosis present

## 2019-09-23 DIAGNOSIS — O24420 Gestational diabetes mellitus in childbirth, diet controlled: Secondary | ICD-10-CM | POA: Diagnosis present

## 2019-09-23 DIAGNOSIS — O133 Gestational [pregnancy-induced] hypertension without significant proteinuria, third trimester: Secondary | ICD-10-CM | POA: Diagnosis present

## 2019-09-23 DIAGNOSIS — O321XX0 Maternal care for breech presentation, not applicable or unspecified: Secondary | ICD-10-CM | POA: Diagnosis present

## 2019-09-23 DIAGNOSIS — O99214 Obesity complicating childbirth: Secondary | ICD-10-CM | POA: Diagnosis present

## 2019-09-23 DIAGNOSIS — D62 Acute posthemorrhagic anemia: Secondary | ICD-10-CM | POA: Diagnosis not present

## 2019-09-23 DIAGNOSIS — Z87891 Personal history of nicotine dependence: Secondary | ICD-10-CM

## 2019-09-23 DIAGNOSIS — Z302 Encounter for sterilization: Secondary | ICD-10-CM | POA: Diagnosis not present

## 2019-09-23 DIAGNOSIS — O9081 Anemia of the puerperium: Secondary | ICD-10-CM | POA: Diagnosis not present

## 2019-09-23 DIAGNOSIS — Z9851 Tubal ligation status: Secondary | ICD-10-CM

## 2019-09-23 DIAGNOSIS — O134 Gestational [pregnancy-induced] hypertension without significant proteinuria, complicating childbirth: Secondary | ICD-10-CM | POA: Diagnosis present

## 2019-09-23 HISTORY — PX: EXTERNAL CEPHALIC VERSION: OBO2

## 2019-09-23 LAB — RAPID HIV SCREEN (HIV 1/2 AB+AG)
HIV 1/2 Antibodies: NONREACTIVE
HIV-1 P24 Antigen - HIV24: NONREACTIVE

## 2019-09-23 LAB — PROTEIN / CREATININE RATIO, URINE
Creatinine, Urine: 147 mg/dL
Protein Creatinine Ratio: 0.27 mg/mg{Cre} — ABNORMAL HIGH (ref 0.00–0.15)
Total Protein, Urine: 39 mg/dL

## 2019-09-23 LAB — COMPREHENSIVE METABOLIC PANEL
ALT: 26 U/L (ref 0–44)
AST: 28 U/L (ref 15–41)
Albumin: 3.1 g/dL — ABNORMAL LOW (ref 3.5–5.0)
Alkaline Phosphatase: 98 U/L (ref 38–126)
Anion gap: 11 (ref 5–15)
BUN: 5 mg/dL — ABNORMAL LOW (ref 6–20)
CO2: 21 mmol/L — ABNORMAL LOW (ref 22–32)
Calcium: 9 mg/dL (ref 8.9–10.3)
Chloride: 104 mmol/L (ref 98–111)
Creatinine, Ser: 0.6 mg/dL (ref 0.44–1.00)
GFR calc Af Amer: >60 mL/min (ref >60)
GFR calc non Af Amer: >60 mL/min (ref >60)
Glucose, Bld: 107 mg/dL — ABNORMAL HIGH (ref 70–99)
Potassium: 3.7 mmol/L (ref 3.5–5.1)
Sodium: 136 mmol/L (ref 135–145)
Total Bilirubin: 0.4 mg/dL (ref 0.3–1.2)
Total Protein: 7 g/dL (ref 6.5–8.1)

## 2019-09-23 LAB — CBC
HCT: 36.9 % (ref 36.0–46.0)
Hemoglobin: 11.6 g/dL — ABNORMAL LOW (ref 12.0–15.0)
MCH: 28.3 pg (ref 26.0–34.0)
MCHC: 31.4 g/dL (ref 30.0–36.0)
MCV: 90 fL (ref 80.0–100.0)
Platelets: 302 10*3/uL (ref 150–400)
RBC: 4.1 MIL/uL (ref 3.87–5.11)
RDW: 14.4 % (ref 11.5–15.5)
WBC: 5.9 10*3/uL (ref 4.0–10.5)
nRBC: 0 % (ref 0.0–0.2)

## 2019-09-23 LAB — URIC ACID: Uric Acid, Serum: 5 mg/dL (ref 2.5–7.1)

## 2019-09-23 LAB — TYPE AND SCREEN
ABO/RH(D): B POS
Antibody Screen: NEGATIVE

## 2019-09-23 LAB — RESPIRATORY PANEL BY RT PCR (FLU A&B, COVID)
Influenza A by PCR: NEGATIVE
Influenza B by PCR: NEGATIVE
SARS Coronavirus 2 by RT PCR: NEGATIVE

## 2019-09-23 LAB — CHLAMYDIA/NGC RT PCR (ARMC ONLY)
Chlamydia Tr: NOT DETECTED
N gonorrhoeae: NOT DETECTED

## 2019-09-23 LAB — GLUCOSE, CAPILLARY
Glucose-Capillary: 102 mg/dL — ABNORMAL HIGH (ref 70–99)
Glucose-Capillary: 86 mg/dL (ref 70–99)

## 2019-09-23 SURGERY — Surgical Case
Anesthesia: Spinal | Site: Abdomen

## 2019-09-23 MED ORDER — MEPERIDINE HCL 25 MG/ML IJ SOLN: 6.2500 mg | INTRAMUSCULAR | Status: DC | PRN

## 2019-09-23 MED ORDER — DIPHENHYDRAMINE HCL 25 MG PO CAPS: 25.0000 mg | ORAL_CAPSULE | ORAL | Status: DC | PRN

## 2019-09-23 MED ORDER — DIBUCAINE (PERIANAL) 1 % EX OINT: 1.0000 "application " | TOPICAL_OINTMENT | CUTANEOUS | Status: DC | PRN

## 2019-09-23 MED ORDER — SODIUM CHLORIDE 0.9% FLUSH: 3.0000 mL | INTRAVENOUS | Status: DC | PRN

## 2019-09-23 MED ORDER — SIMETHICONE 80 MG PO CHEW
80.0000 mg | CHEWABLE_TABLET | ORAL | Status: DC
  Administered 2019-09-26: 08:00:00 80 mg via ORAL
  Filled 2019-09-23: qty 1

## 2019-09-23 MED ORDER — KETOROLAC TROMETHAMINE 30 MG/ML IJ SOLN: 30.0000 mg | Freq: Four times a day (QID) | INTRAMUSCULAR | Status: AC | PRN

## 2019-09-23 MED ORDER — PHENYLEPHRINE HCL (PRESSORS) 10 MG/ML IV SOLN
INTRAVENOUS | Status: AC
  Filled 2019-09-23: qty 1

## 2019-09-23 MED ORDER — LACTATED RINGERS IV SOLN: INTRAVENOUS | Status: DC

## 2019-09-23 MED ORDER — LIDOCAINE HCL (PF) 1 % IJ SOLN: 30.0000 mL | INTRAMUSCULAR | Status: DC | PRN

## 2019-09-23 MED ORDER — PROPOFOL 10 MG/ML IV BOLUS
INTRAVENOUS | Status: AC
  Filled 2019-09-23: qty 20

## 2019-09-23 MED ORDER — BUPIVACAINE HCL (PF) 0.5 % IJ SOLN
INTRAMUSCULAR | Status: AC
  Filled 2019-09-23: qty 30

## 2019-09-23 MED ORDER — NALBUPHINE HCL 10 MG/ML IJ SOLN: 5.0000 mg | Freq: Once | INTRAMUSCULAR | Status: DC | PRN

## 2019-09-23 MED ORDER — BUPIVACAINE IN DEXTROSE 0.75-8.25 % IT SOLN
INTRATHECAL | Status: DC | PRN
  Administered 2019-09-23: 1.5 mL via INTRATHECAL

## 2019-09-23 MED ORDER — LABETALOL HCL 5 MG/ML IV SOLN: 80.0000 mg | INTRAVENOUS | Status: DC | PRN

## 2019-09-23 MED ORDER — ACETAMINOPHEN 325 MG PO TABS: 650.0000 mg | ORAL_TABLET | ORAL | Status: DC | PRN

## 2019-09-23 MED ORDER — NALBUPHINE HCL 10 MG/ML IJ SOLN: 5.0000 mg | INTRAMUSCULAR | Status: DC | PRN

## 2019-09-23 MED ORDER — MEASLES, MUMPS & RUBELLA VAC IJ SOLR
0.5000 mL | Freq: Once | INTRAMUSCULAR | Status: DC
  Filled 2019-09-23: qty 0.5

## 2019-09-23 MED ORDER — OXYTOCIN 40 UNITS IN NORMAL SALINE INFUSION - SIMPLE MED: 2.5000 [IU]/h | INTRAVENOUS | Status: AC

## 2019-09-23 MED ORDER — ONDANSETRON HCL 4 MG/2ML IJ SOLN: 4.0000 mg | Freq: Four times a day (QID) | INTRAMUSCULAR | Status: DC | PRN

## 2019-09-23 MED ORDER — BUPIVACAINE LIPOSOME 1.3 % IJ SUSP
INTRAMUSCULAR | Status: AC
  Filled 2019-09-23: qty 20

## 2019-09-23 MED ORDER — MINERAL OIL LIGHT OIL
TOPICAL_OIL | Freq: Once | Status: DC
  Filled 2019-09-23: qty 10

## 2019-09-23 MED ORDER — SIMETHICONE 80 MG PO CHEW
80.0000 mg | CHEWABLE_TABLET | Freq: Three times a day (TID) | ORAL | Status: DC
  Administered 2019-09-24 – 2019-09-25 (×6): 80 mg via ORAL
  Filled 2019-09-23 (×7): qty 1

## 2019-09-23 MED ORDER — DIPHENHYDRAMINE HCL 50 MG/ML IJ SOLN
12.5000 mg | INTRAMUSCULAR | Status: DC | PRN
  Administered 2019-09-23: 12.5 mg via INTRAVENOUS
  Filled 2019-09-23: qty 1

## 2019-09-23 MED ORDER — FENTANYL CITRATE (PF) 100 MCG/2ML IJ SOLN: 25.0000 ug | INTRAMUSCULAR | Status: DC | PRN

## 2019-09-23 MED ORDER — WITCH HAZEL-GLYCERIN EX PADS: 1.0000 "application " | MEDICATED_PAD | CUTANEOUS | Status: DC | PRN

## 2019-09-23 MED ORDER — COCONUT OIL OIL
1.0000 "application " | TOPICAL_OIL | Status: DC | PRN
  Administered 2019-09-24: 1 via TOPICAL
  Filled 2019-09-23: qty 120

## 2019-09-23 MED ORDER — ONDANSETRON HCL 4 MG/2ML IJ SOLN: 4.0000 mg | Freq: Three times a day (TID) | INTRAMUSCULAR | Status: DC | PRN

## 2019-09-23 MED ORDER — ONDANSETRON HCL 4 MG/2ML IJ SOLN
INTRAMUSCULAR | Status: DC | PRN
  Administered 2019-09-23: 4 mg via INTRAVENOUS

## 2019-09-23 MED ORDER — MENTHOL 3 MG MT LOZG
1.0000 | LOZENGE | OROMUCOSAL | Status: DC | PRN
  Filled 2019-09-23: qty 9

## 2019-09-23 MED ORDER — FENTANYL CITRATE (PF) 100 MCG/2ML IJ SOLN
INTRAMUSCULAR | Status: AC
  Filled 2019-09-23: qty 2

## 2019-09-23 MED ORDER — TERBUTALINE SULFATE 1 MG/ML IJ SOLN
0.2500 mg | Freq: Once | INTRAMUSCULAR | Status: AC
  Administered 2019-09-23: 10:00:00 0.25 mg via SUBCUTANEOUS
  Filled 2019-09-23: qty 1

## 2019-09-23 MED ORDER — TETANUS-DIPHTH-ACELL PERTUSSIS 5-2.5-18.5 LF-MCG/0.5 IM SUSP: 0.5000 mL | Freq: Once | INTRAMUSCULAR | Status: DC

## 2019-09-23 MED ORDER — FLEET ENEMA 7-19 GM/118ML RE ENEM: 1.0000 | ENEMA | Freq: Every day | RECTAL | Status: DC | PRN

## 2019-09-23 MED ORDER — IBUPROFEN 800 MG PO TABS: 800.0000 mg | ORAL_TABLET | Freq: Four times a day (QID) | ORAL | Status: DC

## 2019-09-23 MED ORDER — KETOROLAC TROMETHAMINE 30 MG/ML IJ SOLN
30.0000 mg | Freq: Four times a day (QID) | INTRAMUSCULAR | Status: AC | PRN
  Administered 2019-09-23: 30 mg via INTRAVENOUS
  Filled 2019-09-23: qty 1

## 2019-09-23 MED ORDER — MORPHINE SULFATE (PF) 0.5 MG/ML IJ SOLN
INTRAMUSCULAR | Status: AC
  Filled 2019-09-23: qty 10

## 2019-09-23 MED ORDER — LACTATED RINGERS IV SOLN
500.0000 mL | INTRAVENOUS | Status: DC | PRN
  Administered 2019-09-23: 1000 mL via INTRAVENOUS

## 2019-09-23 MED ORDER — SODIUM CHLORIDE (PF) 0.9 % IJ SOLN
INTRAMUSCULAR | Status: AC
  Filled 2019-09-23: qty 50

## 2019-09-23 MED ORDER — ACETAMINOPHEN 500 MG PO TABS
1000.0000 mg | ORAL_TABLET | Freq: Four times a day (QID) | ORAL | Status: DC
  Administered 2019-09-24: 1000 mg via ORAL
  Filled 2019-09-23: qty 2

## 2019-09-23 MED ORDER — OXYTOCIN 40 UNITS IN NORMAL SALINE INFUSION - SIMPLE MED
INTRAVENOUS | Status: AC
  Filled 2019-09-23: qty 1000

## 2019-09-23 MED ORDER — NALOXONE HCL 0.4 MG/ML IJ SOLN: 0.4000 mg | INTRAMUSCULAR | Status: DC | PRN

## 2019-09-23 MED ORDER — DEXTROSE 5 % IV SOLN
3.0000 g | INTRAVENOUS | Status: AC
  Administered 2019-09-23: 3 g via INTRAVENOUS
  Filled 2019-09-23: qty 3

## 2019-09-23 MED ORDER — BISACODYL 10 MG RE SUPP: 10.0000 mg | Freq: Every day | RECTAL | Status: DC | PRN

## 2019-09-23 MED ORDER — DIPHENHYDRAMINE HCL 25 MG PO CAPS: 25.0000 mg | ORAL_CAPSULE | Freq: Four times a day (QID) | ORAL | Status: DC | PRN

## 2019-09-23 MED ORDER — SIMETHICONE 80 MG PO CHEW
80.0000 mg | CHEWABLE_TABLET | ORAL | Status: DC | PRN
  Administered 2019-09-24: 22:00:00 80 mg via ORAL
  Filled 2019-09-23: qty 1

## 2019-09-23 MED ORDER — HYDRALAZINE HCL 20 MG/ML IJ SOLN: 10.0000 mg | INTRAMUSCULAR | Status: DC | PRN

## 2019-09-23 MED ORDER — FAMOTIDINE 20 MG PO TABS: 10.0000 mg | ORAL_TABLET | Freq: Every day | ORAL | Status: DC | PRN

## 2019-09-23 MED ORDER — PRENATAL PLUS 27-1 MG PO TABS
1.0000 | ORAL_TABLET | Freq: Every day | ORAL | Status: DC
  Administered 2019-09-24 – 2019-09-25 (×2): 1 via ORAL
  Filled 2019-09-23 (×2): qty 1

## 2019-09-23 MED ORDER — SOD CITRATE-CITRIC ACID 500-334 MG/5ML PO SOLN
30.0000 mL | ORAL | Status: AC
  Administered 2019-09-23: 30 mL via ORAL

## 2019-09-23 MED ORDER — SOD CITRATE-CITRIC ACID 500-334 MG/5ML PO SOLN
ORAL | Status: AC
  Filled 2019-09-23: qty 30

## 2019-09-23 MED ORDER — GABAPENTIN 300 MG PO CAPS
300.0000 mg | ORAL_CAPSULE | Freq: Every day | ORAL | Status: DC
  Administered 2019-09-24 – 2019-09-25 (×2): 300 mg via ORAL
  Filled 2019-09-23 (×2): qty 1

## 2019-09-23 MED ORDER — SODIUM CHLORIDE 0.9 % IV SOLN
INTRAVENOUS | Status: DC | PRN
  Administered 2019-09-23: 70 mL

## 2019-09-23 MED ORDER — MORPHINE SULFATE (PF) 0.5 MG/ML IJ SOLN
INTRAMUSCULAR | Status: DC | PRN
  Administered 2019-09-23: .2 mg via EPIDURAL

## 2019-09-23 MED ORDER — BUTORPHANOL TARTRATE 1 MG/ML IJ SOLN: 1.0000 mg | INTRAMUSCULAR | Status: DC | PRN

## 2019-09-23 MED ORDER — BUPIVACAINE HCL (PF) 0.5 % IJ SOLN
INTRAMUSCULAR | Status: DC | PRN
  Administered 2019-09-23: 30 mL

## 2019-09-23 MED ORDER — ACETAMINOPHEN 500 MG PO TABS
1000.0000 mg | ORAL_TABLET | Freq: Four times a day (QID) | ORAL | Status: DC
  Administered 2019-09-23: 23:00:00 1000 mg via ORAL
  Filled 2019-09-23: qty 2

## 2019-09-23 MED ORDER — KETOROLAC TROMETHAMINE 30 MG/ML IJ SOLN
30.0000 mg | Freq: Four times a day (QID) | INTRAMUSCULAR | Status: DC
  Administered 2019-09-24: 05:00:00 30 mg via INTRAVENOUS
  Filled 2019-09-23: qty 1

## 2019-09-23 MED ORDER — LABETALOL HCL 5 MG/ML IV SOLN: 40.0000 mg | INTRAVENOUS | Status: DC | PRN

## 2019-09-23 MED ORDER — NALOXONE HCL 4 MG/10ML IJ SOLN
1.0000 ug/kg/h | INTRAVENOUS | Status: DC | PRN
  Filled 2019-09-23: qty 5

## 2019-09-23 MED ORDER — ONDANSETRON HCL 4 MG/2ML IJ SOLN: 4.0000 mg | Freq: Once | INTRAMUSCULAR | Status: DC | PRN

## 2019-09-23 MED ORDER — SODIUM CHLORIDE 0.9 % IV SOLN
INTRAVENOUS | Status: DC | PRN
  Administered 2019-09-23: 40 mL/h via INTRAVENOUS

## 2019-09-23 MED ORDER — DOXYLAMINE SUCCINATE (SLEEP) 25 MG PO TABS
12.5000 mg | ORAL_TABLET | Freq: Three times a day (TID) | ORAL | Status: DC
  Filled 2019-09-23: qty 1

## 2019-09-23 MED ORDER — OXYTOCIN 40 UNITS IN NORMAL SALINE INFUSION - SIMPLE MED
INTRAVENOUS | Status: DC | PRN
  Administered 2019-09-23: 599 mL via INTRAVENOUS
  Administered 2019-09-23: 1 mL via INTRAVENOUS

## 2019-09-23 MED ORDER — PHENYLEPHRINE 40 MCG/ML (10ML) SYRINGE FOR IV PUSH (FOR BLOOD PRESSURE SUPPORT)
PREFILLED_SYRINGE | INTRAVENOUS | Status: DC | PRN
  Administered 2019-09-23 (×3): 100 ug via INTRAVENOUS

## 2019-09-23 MED ORDER — SOD CITRATE-CITRIC ACID 500-334 MG/5ML PO SOLN: 30.0000 mL | ORAL | Status: DC | PRN

## 2019-09-23 MED ORDER — SUCCINYLCHOLINE CHLORIDE 20 MG/ML IJ SOLN
INTRAMUSCULAR | Status: AC
  Filled 2019-09-23: qty 1

## 2019-09-23 MED ORDER — VITAMIN B-6 50 MG PO TABS
25.0000 mg | ORAL_TABLET | Freq: Three times a day (TID) | ORAL | Status: DC | PRN
  Filled 2019-09-23: qty 0.5

## 2019-09-23 MED ORDER — OXYCODONE HCL 5 MG PO TABS: 5.0000 mg | ORAL_TABLET | ORAL | Status: DC | PRN

## 2019-09-23 MED ORDER — SENNOSIDES-DOCUSATE SODIUM 8.6-50 MG PO TABS
2.0000 | ORAL_TABLET | ORAL | Status: DC
  Administered 2019-09-24 – 2019-09-26 (×3): 2 via ORAL
  Filled 2019-09-23 (×3): qty 2

## 2019-09-23 MED ORDER — LABETALOL HCL 5 MG/ML IV SOLN: 20.0000 mg | INTRAVENOUS | Status: DC | PRN

## 2019-09-23 SURGICAL SUPPLY — 29 items
BARRIER ADHS 3X4 INTERCEED (GAUZE/BANDAGES/DRESSINGS) ×4 IMPLANT
CANISTER SUCT 3000ML PPV (MISCELLANEOUS) ×4 IMPLANT
CHLORAPREP W/TINT 26 (MISCELLANEOUS) ×4 IMPLANT
CLOSURE WOUND 1/2 X4 (GAUZE/BANDAGES/DRESSINGS) ×2
COVER WAND RF STERILE (DRAPES) ×4 IMPLANT
DRSG TELFA 3X8 NADH (GAUZE/BANDAGES/DRESSINGS) ×4 IMPLANT
ELECT REM PT RETURN 9FT ADLT (ELECTROSURGICAL) ×4
ELECTRODE REM PT RTRN 9FT ADLT (ELECTROSURGICAL) ×2 IMPLANT
GAUZE SPONGE 4X4 12PLY STRL (GAUZE/BANDAGES/DRESSINGS) ×4 IMPLANT
GOWN STRL REUS W/ TWL LRG LVL3 (GOWN DISPOSABLE) ×6 IMPLANT
GOWN STRL REUS W/TWL LRG LVL3 (GOWN DISPOSABLE) ×6
NEEDLE HYPO 25GX1X1/2 BEV (NEEDLE) ×4 IMPLANT
NS IRRIG 1000ML POUR BTL (IV SOLUTION) ×4 IMPLANT
PACK C SECTION AR (MISCELLANEOUS) ×4 IMPLANT
PAD OB MATERNITY 4.3X12.25 (PERSONAL CARE ITEMS) ×4 IMPLANT
PAD PREP 24X41 OB/GYN DISP (PERSONAL CARE ITEMS) ×4 IMPLANT
PENCIL SMOKE ULTRAEVAC 22 CON (MISCELLANEOUS) ×4 IMPLANT
RTRCTR C-SECT PINK 25CM LRG (MISCELLANEOUS) ×4 IMPLANT
STAPLER INSORB 30 2030 C-SECTI (MISCELLANEOUS) ×4 IMPLANT
STRIP CLOSURE SKIN 1/2X4 (GAUZE/BANDAGES/DRESSINGS) ×6 IMPLANT
SUT MNCRL 4-0 (SUTURE) ×2
SUT MNCRL 4-0 27XMFL (SUTURE) ×2
SUT VIC AB 0 CT1 36 (SUTURE) ×8 IMPLANT
SUT VIC AB 0 CTX 36 (SUTURE) ×4
SUT VIC AB 0 CTX36XBRD ANBCTRL (SUTURE) ×4 IMPLANT
SUT VIC AB 2-0 SH 27 (SUTURE) ×4
SUT VIC AB 2-0 SH 27XBRD (SUTURE) ×4 IMPLANT
SUTURE MNCRL 4-0 27XMF (SUTURE) ×2 IMPLANT
SYR 30ML LL (SYRINGE) ×8 IMPLANT

## 2019-09-23 NOTE — Anesthesia Preprocedure Evaluation (Signed)
Anesthesia Evaluation  Patient identified by MRN, date of birth, ID band Patient awake    Reviewed: Allergy & Precautions, NPO status , Patient's Chart, lab work & pertinent test results  Airway Mallampati: III       Dental  (+) Teeth Intact   Pulmonary former smoker,    Pulmonary exam normal        Cardiovascular negative cardio ROS Normal cardiovascular exam     Neuro/Psych negative neurological ROS  negative psych ROS   GI/Hepatic negative GI ROS, Neg liver ROS,   Endo/Other  diabetesMorbid obesity  Renal/GU negative Renal ROS  negative genitourinary   Musculoskeletal negative musculoskeletal ROS (+)   Abdominal Normal abdominal exam  (+)   Peds negative pediatric ROS (+)  Hematology  (+) anemia ,   Anesthesia Other Findings   Reproductive/Obstetrics (+) Pregnancy                             Anesthesia Physical Anesthesia Plan  ASA: II  Anesthesia Plan: Spinal   Post-op Pain Management:    Induction: Intravenous  PONV Risk Score and Plan:   Airway Management Planned: Nasal Cannula  Additional Equipment:   Intra-op Plan:   Post-operative Plan:   Informed Consent: I have reviewed the patients History and Physical, chart, labs and discussed the procedure including the risks, benefits and alternatives for the proposed anesthesia with the patient or authorized representative who has indicated his/her understanding and acceptance.     Dental advisory given  Plan Discussed with: CRNA and Surgeon  Anesthesia Plan Comments:         Anesthesia Quick Evaluation

## 2019-09-23 NOTE — Anesthesia Procedure Notes (Signed)
Spinal  Patient location during procedure: OR Staffing Performed: anesthesiologist  Anesthesiologist: Gunnar Bulla, MD Preanesthetic Checklist Completed: patient identified, IV checked, site marked, risks and benefits discussed, surgical consent, pre-op evaluation and timeout performed Spinal Block Patient position: sitting Prep: ChloraPrep Patient monitoring: heart rate, cardiac monitor, continuous pulse ox and blood pressure Approach: midline Location: L3-4 Injection technique: single-shot Needle Needle type: Pencil-Tip  Needle gauge: 25 G Needle length: 9 cm Assessment Sensory level: T8

## 2019-09-23 NOTE — Discharge Summary (Signed)
Obstetrical Discharge Summary  Patient Name: Tammy Townsend DOB: 09-26-1991 MRN: [id number]  Date of Admission: 09/23/2019 Date of Discharge: 09/26/2019  Primary OB: [facility]   Gestational Age at Delivery: [redacted]w[redacted]d   Antepartum complications:   Gestational hypertension  1. Chlamydia + 02/2019:   Rx Azithromycin 1000mg  x 1 to pharmacy for tx of chlamydia, expedited partner treatment provided as well.   TOC 7/25 = negative [facility]) 2. Hyperemesis:   ER use x 3 before [redacted]wks gestation, on phenergan, carafate scheduled  not working well, Rx Zofran at 11.5wks  elevated LFTs during recent hospitalization, repeat CMP at [facility].   hospitalized again 7/25 for same.  10/15: Doing well with Zofran scheduled, B6 and once daily reglan 5mg  3. Gestational Diabetes: 1hr GTT 218  Lifestyles referral 07/11/19- Seen 10/23  Well controlled, morning diet only 4. Prepregnancy BMI 40.6  early GTT 115 5. Low Creatinine and BUN  0.3 and <5 on recent hospitalizations  >>protein on UA (>300 and >100) 6. Elevated transaminases:  7/25 improved with hydration, food  RUQ US showed biliary stasis   high suspicion for cholestasis in 3rd trimester - check bile acids w 28wk labs--> normal 07/11/19 and she remains asx at term 7. S>D at 27wks: growth [place] 10/29- EFW 60% 8. Mild anemia, Hgb 10.2- is on po iron 9. Wants tubal   Mcd consent signed 10/29 Admitting Diagnosis: Breech presentation, gestational hypertension Secondary Diagnosis: Patient Active Problem List   Diagnosis Date Noted  . Cesarean delivery delivered 09/26/2019  . S/P tubal ligation 09/26/2019  . Acute blood loss anemia 79/10/4095    Complications: None Intrapartum complications/course:  Failed ECV Breech presentation Gestational hypertension  Date of Delivery: 09/23/2019 Delivered By: Benjaman Kindler Delivery Type: primary cesarean section, low transverse incision  (w/BTL) Anesthesia: spinal Placenta:  Extracted Newborn Data: Live born female "Ariel" Birth Weight: 7 lb 10.4 oz (3470 g) APGAR: 8, 9  Newborn Delivery   Birth date/time: 09/23/2019 19:14:00 Delivery type: C-Section, Low Transverse Trial of labor: No C-section categorization: [place] is a [id number] who underwent cesarean section on 09/23/2019.  Patient had an uncomplicated surgery; for further details of this surgery, please refer to the operative note.  Patient had an uncomplicated postpartum course.  By time of discharge on POD#3, her pain was controlled on oral pain medications; she had appropriate lochia and was ambulating, voiding without difficulty, tolerating regular diet and passing flatus.   She was deemed stable for discharge to home.     Discharge Physical Exam:  BP 129/68 (BP Location: Right Arm)   Pulse 75   Temp 98.5 F (36.9 C) (Oral)   Resp 20   Ht 5\' 5"  (1.651 m)   Wt 127 kg   LMP 01/03/2019   SpO2 98%   Breastfeeding Unknown   BMI 46.59 kg/m   General: NAD CV: RRR Pulm: CTABL, nl effort ABD: s/nd/nt, fundus firm and below the umbilicus Lochia: moderate Incision: c/d/i  DVT Evaluation: LE non-ttp, no evidence of DVT on exam.  Hemoglobin  Date Value Ref Range Status  09/25/2019 9.7 (L) 12.0 - 15.0 g/dL Final   HCT  Date Value Ref Range Status  09/25/2019 30.3 (L) 36.0 - 46.0 % Final    Post partum course: Complicated by acute blood loss anemia - hemodynamically stable and asymptomatic; was started on PO ferrous sulfate BID with stool softeners  Postpartum Procedures: none Disposition: stable,  discharge to home. Baby Feeding: formula Baby Disposition: home with mom  Rh Immune globulin given: n/a Rubella vaccine given: n/a Tdap vaccine given in AP or PP setting: declined Flu vaccine given in AP or PP setting: declined  Contraception: BTL  Prenatal Labs: Blood type/Rh --/--/B POS (12/28 0940)  Antibody screen neg  Rubella Immune   Varicella Immune  RPR NR  HBsAg Neg  HIV NR  GC neg  Chlamydia neg  Genetic screening negative  1 hour GTT 218  3 hour GTT n/a  GBS unknown     Plan:  Cortney V Lun was discharged to home in good condition. Follow-up appointment at [facility] with delivering provider in 2 weeks by video visit   Discharge Medications: Allergies as of 09/26/2019   No Known Allergies     Medication List    STOP taking these medications   doxylamine (Sleep) 25 MG tablet Commonly known as: UNISOM   ondansetron 4 MG disintegrating tablet Commonly known as: Zofran ODT   pyridOXINE 25 MG tablet Commonly known as: VITAMIN B-6     TAKE these medications   acetaminophen 325 MG tablet Commonly known as: TYLENOL Take 2 tablets (650 mg total) by mouth every 4 (four) hours as needed (for pain scale < 4  OR  temperature  >/=  100.5 F).   docusate sodium 100 MG capsule Commonly known as: COLACE Take 1 capsule (100 mg total) by mouth daily.   famotidine 10 MG tablet Commonly known as: PEPCID Take 10 mg by mouth daily as needed for heartburn or indigestion.   ferrous sulfate 325 (65 FE) MG tablet Take 1 tablet (325 mg total) by mouth 2 (two) times daily with a meal.   ibuprofen 800 MG tablet Commonly known as: ADVIL Take 1 tablet (800 mg total) by mouth every 6 (six) hours.   prenatal vitamin w/FE, FA 27-1 MG Tabs tablet Take 1 tablet by mouth daily at 12 noon.       Follow-up Information    Christeen Douglas, MD. Schedule an appointment as soon as possible for a visit in 2 week(s).   Specialty: Obstetrics and Gynecology Contact information: [address] [place] [address] [phone]           Signed: Cyril Mourning, CNM 09/26/19 8:01 AM

## 2019-09-23 NOTE — Procedures (Signed)
Tammy Mainwaring McCoyis [id number] at [redacted]w[redacted]d with breech presentation who desires external cephalic version.  Preop: Breech presentation Postop: breech presentation   External cephalic version attempted with mineral oil x3. 0.5mg  of iv dilaudid given midway through the procedure. Fetal heart rate documented in the normal baseline range throughout procedure. Bedside ultrasound confirmed persistent breech at the end of the procedure.   Position: Head at maternal left upper quadrant with spine to maternal right. Placenta anterior and normal fluid. One attempt for forward somersault in the clockwise position, an attempt in the counterclock wise position, and a final attempt in the clockwise position.  Postprocedure, continuous fetal monitoring until baby was reactive.  Vitals:   09/23/19 0921 09/23/19 0937  BP: (!) 156/93 (!) 146/89  Pulse: 94 87  Resp: 18   Temp: 98.5 F (36.9 C)     Maternal gHTN notable today. We will proceed with delivery today.

## 2019-09-23 NOTE — Progress Notes (Signed)
Pt presents to L&D for scheduled external version. B.Beasley, MD aware and at bedside.

## 2019-09-23 NOTE — Transfer of Care (Signed)
Immediate Anesthesia Transfer of Care Note  Patient: Tammy Townsend  Procedure(s) Performed: CESAREAN SECTION WITH BILATERAL TUBAL LIGATION (Bilateral Abdomen)  Patient Location: PACU  Anesthesia Type:Spinal  Level of Consciousness: awake, alert  and oriented  Airway & Oxygen Therapy: Patient Spontanous Breathing  Post-op Assessment: Report given to RN and Post -op Vital signs reviewed and stable  Post vital signs: Reviewed and stable  Last Vitals:  Vitals Value Taken Time  BP 131/73 09/23/19 2010  Temp 36.4 C 09/23/19 2010  Pulse 79 09/23/19 2010  Resp 13 09/23/19 2010  SpO2      Last Pain:  Vitals:   09/23/19 2010  TempSrc: Oral  PainSc:          Complications: No apparent anesthesia complications

## 2019-09-23 NOTE — Op Note (Signed)
Cesarean Section Procedure Note  Indications: malpresentation: breech  Pre-operative Diagnosis:  1. Intrauterine pregnancy at [redacted]w[redacted]d;  2. Desires permanent sterilization; 3. Gestational hypertension 4. Breech presentation  Post-operative Diagnosis: same, delivered.  Procedure: 1. Primary Low Transverse Cesarean Section through Pfannenstiel incision  2. Bilateral tubal sterilization using modified Parkland method, incorporating the fimbriated ends.  Surgeon: Benjaman Kindler, MD  Assistant(s):  CST, Lillia Mountain  Anesthesia: Spinal anesthesia  Estimated Blood Loss:  300 mL         Drains: Foley         Total IV Fluids: 1263ml  Urine Output: 75ml         Specimens: Portion of left and right tubes         Complications:  None; patient tolerated the procedure well.         Disposition: PACU - hemodynamically stable.         Condition: stable  Findings:  A female infant "Tammy Townsend" in complete breech presentation. Amniotic fluid - Clear  Birth weight 3470 g.  Apgars of 8 and 9.   Intact placenta with a three-vessel cord.  Grossly normal uterus, tubes and ovaries bilaterally. No intraabdominal adhesions were noted.  Procedure Details  The patient was taken to Operating Room, identified as the correct patient and the procedure verified as C-Section Delivery. A Time Out was held and the above information confirmed.  After induction of anesthesia, the patient was draped and prepped in the usual sterile manner. A Pfannenstiel incision was made and carried down through the subcutaneous tissue to the fascia. Fascial incision was made and extended transversely with the Mayo scissors. The fascia was separated from the underlying rectus tissue superiorly and inferiorly. The peritoneum was identified and entered bluntly. Peritoneal incision was extended longitudinally. The utero-vesical peritoneal reflection was incised transversely and a bladder flap was created digitally.   A low  transverse hysterotomy was made. The fetus was delivered atraumatically. The umbilical cord was clamped x2 and cut and the infant was handed to the awaiting pediatricians. The placenta was removed intact and appeared normal with a 3-vessel cord.   The uterus was exteriorized and cleared of all clot and debris. The hysterotomy was closed with running sutures of  0-Vicryl. Excellent hemostasis was observed.   Attention was then turned to the tubal ligation. The left fallopian tube distinguished from the round ligament by identifying the fimbria and was grasped with a Babcock clamp in the midisthmic portion approximately 3 cm from the cornual region. It was then doubly ligated with 0-plain gut suture in a Parkland fashion, including fimbriated ends. The tubal segment was excised with Metzenbaum scissors. Tubal ostea noted. The procedure was repeated on the right side. *Care was noted to examine both tubal sites in situ to ensure the sutures were intact and no bleeding was noted. The uterus was returned to the abdomen.  The pelvis was irrigated and again, excellent hemostasis was noted. The fascia was then reapproximated with running sutures of 0 Vicryl.  The subcutaneous tissue was reapproximated with running sutures of 0 vicryl. The skin was reapproximated with Insorb absorbable staples. Exparel was used on the fascial and skin planes in standard fashion.  Instrument, sponge, and needle counts were correct prior to the abdominal closure and at the conclusion of the case.   The patient tolerated the procedure well and was transferred to the recovery room in stable condition.   Benjaman Kindler, MD12/28/2020

## 2019-09-23 NOTE — H&P (Addendum)
OB ADMISSION/ HISTORY & PHYSICAL:  Admission Date: 09/23/2019  9:15 AM  Admit Diagnosis: Persistent breech presentation and gestational hypertension  Tammy Townsend is a 28 y.o. G2P0010 presenting for ECV, which was unsuccessful. On admission, pt with elevated BP 156/93 with repeat 146/89. Decision made to proceed with delivery  Prenatal History: G2P0010   EDC : 10/10/2019, by Last Menstrual Period  Prenatal care at [facility] Prenatal course complicated by  1. Chlamydia + 02/2019:   Rx Azithromycin 1000mg  x 1 to pharmacy for tx of chlamydia, expedited partner treatment provided as well.   TOC 7/25 = negative [facility]) 2. Hyperemesis:   ER use x 3 before [redacted]wks gestation, on phenergan, carafate scheduled  not working well, Rx Zofran at 11.5wks  elevated LFTs during recent hospitalization, repeat CMP at [facility].   hospitalized again 7/25 for same.  10/15: Doing well with Zofran scheduled, B6 and once daily reglan 5mg  3. Gestational Diabetes: 1hr GTT 218  Lifestyles referral 07/11/19- Seen 10/23  Well controlled, morning diet only 4. Prepregnancy BMI 40.6  early GTT 115 5. Low Creatinine and BUN  0.3 and <5 on recent hospitalizations  >>protein on UA (>300 and >100) 6. Elevated transaminases:  7/25 improved with hydration, food  RUQ US showed biliary stasis   high suspicion for cholestasis in 3rd trimester - check bile acids w 28wk labs--> normal 07/11/19 and she remains asx at term 7. S>D at 27wks: growth [place] 10/29- EFW 60% 8. Mild anemia, Hgb 10.2- is on po iron 9. Wants tubal   Mcd consent signed 10/29    Medical / Surgical History :  Past medical history:  Past Medical History:  Diagnosis Date  . Anemia   . Gestational diabetes      Past surgical history:  Past Surgical History:  Procedure Laterality Date  . DILATION AND CURETTAGE OF UTERUS  2013   miscarriage  . EXTERNAL CEPHALIC VERSION  29/93/7169        Family History:  Family History  Problem Relation  Age of Onset  . Cancer Mother   . Diabetes Father   . Hypertension Father   . Heart failure Father      Social History:  reports that she quit smoking about 7 months ago. Her smoking use included cigars. She has a 4.00 pack-year smoking history. She has never used smokeless tobacco. She reports previous alcohol use. She reports previous drug use.   Allergies: Patient has no known allergies.    Current Medications at time of admission:  Prior to Admission medications   Medication Sig Start Date End Date Taking? Authorizing Provider  acetaminophen (TYLENOL) 325 MG tablet Take 2 tablets (650 mg total) by mouth every 4 (four) hours as needed (for pain scale < 4  OR  temperature  >/=  100.5 F). 03/03/19  Yes McVey, Rebecca A, CNM  famotidine (PEPCID) 10 MG tablet Take 10 mg by mouth daily as needed for heartburn or indigestion.   Yes [provider]  ondansetron (ZOFRAN ODT) 4 MG disintegrating tablet Take 1 tablet (4 mg total) by mouth every 8 (eight) hours as needed. 04/17/19  Yes Menshew, Dannielle Karvonen, PA-C  prenatal vitamin w/FE, FA (PRENATAL 1 + 1) 27-1 MG TABS tablet Take 1 tablet by mouth daily at 12 noon.   Yes [provider]  pyridOXINE (VITAMIN B-6) 25 MG tablet Take 1 tablet (25 mg total) by mouth 3 (three) times daily as needed (nausea / vomiting). 03/03/19  Yes McVey, Wells Guiles  A, CNM  docusate sodium (COLACE) 100 MG capsule Take 1 capsule (100 mg total) by mouth daily. Patient not taking: Reported on 07/19/2019 03/04/19   McVey, Prudencio Pair, CNM  doxylamine, Sleep, (UNISOM) 25 MG tablet Take 0.5 tablets (12.5 mg total) by mouth 3 (three) times daily. Take with vitamin b6 Patient not taking: Reported on 07/19/2019 03/03/19   McVey, Prudencio Pair, CNM     Review of Systems: Active FM No headaches, recent weight gain, RUQ pain or facial edema Intermittent contractions    Physical Exam:  VS: Blood pressure (!) 146/89, pulse 87, temperature 98.5 F (36.9 C), temperature  source Oral, resp. rate 18, height 5\' 5"  (1.651 m), weight 127 kg, last menstrual period 01/03/2019.  General: alert and oriented, appears NAD Heart: RRR Lungs: Clear lung fields Abdomen: Gravid, soft and non-tender, non-distended Extremities: 1+ edema  FHT: 145, moderate variability, +accels, no decels TOCO: SVE:    /   /      Cephalic by leopolds  Prenatal Labs: Blood type/Rh --/--/PENDING (12/28 0940)  Antibody screen neg  Rubella Immune  Varicella Immune  RPR NR  HBsAg Neg  HIV NR  GC neg  Chlamydia neg  Genetic screening negative  1 hour GTT 218  3 hour GTT n/a  GBS unknown    03/01/19: Uterus anteverted,Single, viable IUP, S=[redacted]w[redacted]d,FHR=170bpm,Yolk sac and amnion image,,Cervical length=4.43c,,No free fluid seen,Rt complex ovarian cyst=2.3cm  05/14/2019: Anatomy: normal, Presentation: vertex, FHR: 152bpm, Previa: none, Placenta: anterior, Adnexa: right complex ovarian cyst 1.9cm septation 0.23cm, CL: 4.08cm, Fibroid: left lateral 4.1cm, SIUP, S=D  07/25/19 Growth 07/27/19: - Efw=3lb7oz (1550g)=69%, Afi=18.16cm @ 70%, Fhr=138bpm, Placenta=anterior, Position=breech, Fibroid seen: Lt lateral=4.5cm   Assessment: [redacted]w[redacted]d weeks gestation Gestational hypertension FHR category 1   Plan:   1. Fetal Well being  - Fetal Tracing: Cat I - Ultrasound: reviewed, as above - Group B Streptococcus: unknown - Presentation: breech   2. Routine OB: - Prenatal labs reviewed, as above - Rh B positive  3. Post Partum Planning: - Infant feeding: Breast - Contraception: Planned BTL   The risks of cesarean section discussed with the patient included but were not limited to: bleeding which may require transfusion or reoperation; infection which may require antibiotics; injury to bowel, bladder, ureters or other surrounding organs; injury to the fetus; need for additional procedures including hysterectomy in the event of a life-threatening hemorrhage; placental abnormalities wth subsequent  pregnancies, incisional problems, thromboembolic phenomenon and other postoperative/anesthesia complications. The patient concurred with the proposed plan, giving informed written consent for the procedure.   Patient has been NPO since last night and she will remain NPO for procedure. Anesthesia and OR aware. Preoperative prophylactic antibiotics and SCDs ordered on call to the OR.  To OR when ready.   Patient desires permanent sterilization.  Other reversible forms of contraception were discussed with patient; she declines all other modalities. Risks of procedure discussed with patient including but not limited to: risk of regret, permanence of method, bleeding, infection, injury to surrounding organs and need for additional procedures.  Failure risk of 1-2 % with increased risk of ectopic gestation if pregnancy occurs was also discussed with patient.  Patient verbalized understanding of these risks and wants to proceed with sterilization.  Written informed consent obtained.  To OR when ready.

## 2019-09-23 NOTE — Progress Notes (Signed)
Since the surgery was posted, pt has had clears. The OR was unable to staff the case until now, and throughout the fetal monitoring was reassuring, BP remained elevated in mild range, and she developed no new sx. We will proceed with pLTCS with BTL.

## 2019-09-23 NOTE — Anesthesia Post-op Follow-up Note (Signed)
Anesthesia QCDR form completed.        

## 2019-09-23 NOTE — Progress Notes (Signed)
Preop Note for External Cephalic Version   Tammy Townsend is a 28 y.o. G2P0010. She is at [redacted]w[redacted]d gestation.  Indication: Persistent breech presentation  S: Resting comfortably. no CTX, no VB. Active fetal movement. O:  LMP 01/03/2019  No results found for this or any previous visit (from the past 48 hour(s)).   Gen: NAD, AAOx3      Abd: FNTTP      Ext: Non-tender, Nonedmeatous    FHT: 150, mod variability, +accels, no decels TOCO: quiet SVE: deferred      A/P:  28 y.o. G2P0010 [redacted]w[redacted]d with persistent breech presentation.   Labor: not present.   Fetal testing reassuring  ECV discussed, including risks (58% success rate, small chance of spontaneous return to breech, AROM, placental abruption, NRFHT and need for stat c/s, maternal discomfort), benefits (chance at a vaginal birth) and alternatives (expectant management, cesarean section.) She elects to proceed with procedure. We will check CBC and Type and Screen for prn care. She is B pos and has no indication for Rhogam. She will get a fluid bolus prior to start of procedure.

## 2019-09-24 LAB — CBC
HCT: 28.6 % — ABNORMAL LOW (ref 36.0–46.0)
Hemoglobin: 9.3 g/dL — ABNORMAL LOW (ref 12.0–15.0)
MCH: 27.8 pg (ref 26.0–34.0)
MCHC: 32.5 g/dL (ref 30.0–36.0)
MCV: 85.6 fL (ref 80.0–100.0)
Platelets: 249 10*3/uL (ref 150–400)
RBC: 3.34 MIL/uL — ABNORMAL LOW (ref 3.87–5.11)
RDW: 14.3 % (ref 11.5–15.5)
WBC: 10 10*3/uL (ref 4.0–10.5)
nRBC: 0 % (ref 0.0–0.2)

## 2019-09-24 LAB — RPR: RPR Ser Ql: NONREACTIVE

## 2019-09-24 MED ORDER — FERROUS SULFATE 325 (65 FE) MG PO TABS
325.0000 mg | ORAL_TABLET | Freq: Two times a day (BID) | ORAL | Status: DC
  Administered 2019-09-24 – 2019-09-26 (×5): 325 mg via ORAL
  Filled 2019-09-24 (×6): qty 1

## 2019-09-24 MED ORDER — KETOROLAC TROMETHAMINE 30 MG/ML IJ SOLN
30.0000 mg | Freq: Four times a day (QID) | INTRAMUSCULAR | Status: AC
  Administered 2019-09-24 (×2): 30 mg via INTRAVENOUS
  Filled 2019-09-24 (×2): qty 1

## 2019-09-24 MED ORDER — ACETAMINOPHEN 500 MG PO TABS
1000.0000 mg | ORAL_TABLET | Freq: Four times a day (QID) | ORAL | Status: DC
  Administered 2019-09-24 – 2019-09-26 (×8): 1000 mg via ORAL
  Filled 2019-09-24 (×9): qty 2

## 2019-09-24 MED ORDER — IBUPROFEN 800 MG PO TABS
800.0000 mg | ORAL_TABLET | Freq: Four times a day (QID) | ORAL | Status: DC
  Administered 2019-09-24 – 2019-09-26 (×6): 800 mg via ORAL
  Filled 2019-09-24 (×6): qty 1

## 2019-09-24 NOTE — Anesthesia Postprocedure Evaluation (Signed)
Anesthesia Post Note  Patient: Tammy Townsend  Procedure(s) Performed: CESAREAN SECTION WITH BILATERAL TUBAL LIGATION (Bilateral Abdomen)  Patient location during evaluation: Mother Baby Anesthesia Type: Spinal Level of consciousness: awake and alert, oriented and patient cooperative Pain management: pain level controlled Vital Signs Assessment: post-procedure vital signs reviewed and stable Respiratory status: spontaneous breathing and nonlabored ventilation Cardiovascular status: blood pressure returned to baseline and stable Postop Assessment: no headache, no backache, patient able to bend at knees and no apparent nausea or vomiting Anesthetic complications: no     Last Vitals:  Vitals:   09/24/19 0447 09/24/19 0500  BP: (!) 141/85   Pulse: 68 70  Resp: 18   Temp: 36.9 C   SpO2:  96%    Last Pain:  Vitals:   09/24/19 0447  TempSrc: Oral  PainSc:                  Lia Foyer

## 2019-09-24 NOTE — Lactation Note (Signed)
This note was copied from a baby's chart. Lactation Consultation Note  Patient Name: Tammy Townsend [id number] Date: 09/24/2019 Reason for consult: Follow-up assessment  Follow-up visit from this am. Baby Ariel latched in football hold on left side when arrived to room. Lips flanged and audible swallows. Mother denies any pain or pinching.   Mother requesting instructions on how to set-up and use pump. Discussed parts, how to cleanse pieces, and settings on pump. Discussed when pumping would be indicated and assured mom that right now, baby is sufficient for breast stimulation. Mother voices understanding and content with instructions given. Shares that she feels baby is doing well and that will reassess need for pumping later on.   Mother voices no further questions at this time. Encouraged to call if further assistance needed and stated LC will follow-up prior to discharge.     Maternal Data Formula Feeding for Exclusion: No  Feeding Feeding Type: Breast Fed  LATCH Score Latch: Grasps breast easily, tongue down, lips flanged, rhythmical sucking.  Audible Swallowing: A few with stimulation  Type of Nipple: Everted at rest and after stimulation  Comfort (Breast/Nipple): Soft / non-tender  Hold (Positioning): No assistance needed to correctly position infant at breast.  LATCH Score: 9  Interventions Interventions: Breast feeding basics reviewed;Breast massage;Hand express;Adjust position;Support pillows;Position options;Expressed milk;Coconut oil  Lactation Tools Discussed/Used     Consult Status Consult Status: Follow-up Date: 09/24/19 Follow-up type: In-patient   [place] Lactation Student   Lavonia Drafts 09/24/2019, 2:35 PM

## 2019-09-24 NOTE — Plan of Care (Signed)
Vs stable; moving well in bed; urine amount and color have improved; RN to take foley out around 0400 after pt standing at bedside but pt doing skin to skin then breastfeeding; tolerating regular diet; taking tylenol and toradol for pain control; breastfeeding and does need some assistance

## 2019-09-24 NOTE — Progress Notes (Signed)
Post Op Day 1  Subjective: Doing well, no concerns. Ambulating without difficulty, pain managed with PO meds, tolerating regular diet, and voiding without difficulty.   No fever/chills, chest pain, shortness of breath, nausea/vomiting, or leg pain. No nipple or breast pain. No headache, visual changes, or RUQ/epigastric pain.  Objective: BP (!) 141/85   Pulse 70   Temp 98.5 F (36.9 C) (Oral)   Resp 18   Ht 5\' 5"  (1.651 m)   Wt 127 kg   LMP 01/03/2019   SpO2 96%   Breastfeeding Unknown   BMI 46.59 kg/m    Physical Exam:  General: alert, cooperative, appears stated age and no distress Breasts: soft/nontender CV: RRR Pulm: nl effort, CTABL Abdomen: soft, non-tender, active bowel sounds Uterine Fundus: firm Incision: healing well, no significant drainage Lochia: appropriate DVT Evaluation: No evidence of DVT seen on physical exam. No cords or calf tenderness. No significant calf/ankle edema.  Recent Labs    09/23/19 0940 09/24/19 0614  HGB 11.6* 9.3*  HCT 36.9 28.6*  WBC 5.9 10.0  PLT 302 249    Assessment/Plan: 28 y.o. G2P1011 postop day # 1  -Continue routine postpartum care -Lactation consult PRN for breastfeeding  -BTL completed with c-section for contraception. -Acute blood loss anemia - hemodynamically stable and asymptomatic; start PO ferrous sulfate BID with stool softeners  -Immunization status: all immunizations up to date  Disposition: Continue inpatient postpartum care   LOS: 1 day   Lisette Grinder, CNM 09/24/2019, 8:10 AM   ----- Lisette Grinder Certified Nurse Midwife [facility]

## 2019-09-24 NOTE — Lactation Note (Signed)
This note was copied from a baby's chart. Lactation Consultation Note  Patient Name: Tammy Townsend AJOIN'O Date: 09/24/2019 Reason for consult: Initial assessment   LC introduction and initial assessment. Mother reports desire to exclusively breastfeed [employer]. Mother shares that breastfeeding has been going well overall. She shares that her nipples were sensitive during pregnancy, but feel better now after delivery.   Father of baby asking about pumping at this time. He shared that his mother is in the process of purchasing a Medela pump for at home. Educated on indications for pumping. Mother requesting education on how to pump while inpatient. Mother would like to defer education/set-up for later in the afternoon after she has been able to rest.   Discussed breastfeeding basics and expectations for 1st 24hrs of life. Discussed and encouraged to keep tracking wet/dirty diapers as well as feeding attempts. Explained that breastfeeding may cause discomfort, but should not be painful. Discussed normal sleepy behavior at this stage of life and ways to reach goal of 8-12 feeding attempts in 24hr period. Encouraged skin-to-skin with baby.   Per mother, would like lactation to return in the afternoon so that she can rest. Encouraged family to call for further assistance, as needed.   Maternal Data Formula Feeding for Exclusion: No  Feeding Feeding Type: Breast Fed  LATCH Score                   Interventions Interventions: Breast feeding basics reviewed;Skin to skin  Lactation Tools Discussed/Used Pump Review: Other (comment)(deffered for follow-up visit )   Consult Status Consult Status: Follow-up Date: 09/24/19 Follow-up type: In-patient   [place] Lactation Student  Lavonia Drafts 09/24/2019, 10:52 AM

## 2019-09-25 LAB — CBC
HCT: 30.3 % — ABNORMAL LOW (ref 36.0–46.0)
Hemoglobin: 9.7 g/dL — ABNORMAL LOW (ref 12.0–15.0)
MCH: 28.2 pg (ref 26.0–34.0)
MCHC: 32 g/dL (ref 30.0–36.0)
MCV: 88.1 fL (ref 80.0–100.0)
Platelets: 303 10*3/uL (ref 150–400)
RBC: 3.44 MIL/uL — ABNORMAL LOW (ref 3.87–5.11)
RDW: 14.5 % (ref 11.5–15.5)
WBC: 7.9 10*3/uL (ref 4.0–10.5)
nRBC: 0 % (ref 0.0–0.2)

## 2019-09-25 LAB — SURGICAL PATHOLOGY

## 2019-09-25 NOTE — Lactation Note (Signed)
This note was copied from a baby's chart. Lactation Consultation Note  Patient Name: Tammy Townsend [id number] Date: 09/25/2019   [facility] and [facility] student checked in with mom and baby Ariel this morning. Mom and dad report that baby was up most of the night, potentially cluster feeding. Dad encouraged mom to finally rest, as she was up most of the day yesterday contacting/speaking with family members, and provided Ariel with formula overnight. Baby has been content and sleeping ever since. LC and [place] student educated mom on the impact that formula feeding may have on infants desire to breastfeed, and impact on breast stimulation for the onset of milk production. When questioned about use of DEBP set-up yesterday, mom reports not using as of yet. LC provided guidance for breastfeeding goals moving forward, encouraging mom to continue to offer breast before bottle, and use of pump every 2-3 hours for breast stimulation. Mom agreed, [facility] and/or [place] student to follow-up.  Maternal Data    Feeding    LATCH Score                   Interventions    Lactation Tools Discussed/Used     Consult Status      Lavonia Drafts 09/25/2019, 11:20 AM

## 2019-09-25 NOTE — Progress Notes (Addendum)
Post Op Day 2  Subjective: Doing well, no concerns. Ambulating without difficulty, pain managed with PO meds, tolerating regular diet, and voiding without difficulty.   No fever/chills, chest pain, shortness of breath, nausea/vomiting, or leg pain. No nipple or breast pain. No headache, visual changes, or RUQ/epigastric pain.  Objective: BP 124/65 (BP Location: Right Arm)   Pulse 68   Temp 98.6 F (37 C) (Oral)   Resp 20   Ht 5\' 5"  (1.651 m)   Wt 127 kg   LMP 01/03/2019   SpO2 100% Comment: Room Air  Breastfeeding Unknown   BMI 46.59 kg/m    Physical Exam:  General: alert, cooperative, appears stated age and no distress Breasts: soft/nontender CV: RRR Pulm: nl effort, CTABL Abdomen: soft, non-tender, active bowel sounds Uterine Fundus: firm Incision: healing well, no significant drainage Lochia: appropriate DVT Evaluation: No evidence of DVT seen on physical exam. No cords or calf tenderness. No significant calf/ankle edema.  Recent Labs    09/24/19 0614 09/25/19 0553  HGB 9.3* 9.7*  HCT 28.6* 30.3*  WBC 10.0 7.9  PLT 249 303    Assessment/Plan: 28 y.o. G2P1011 postop day # 2  -Continue routine postpartum care -Lactation consult PRN for breastfeeding. Pt is interested in formula feeding at this time -BTL completed with c-section for contraception. -Acute blood loss anemia - hemodynamically stable and asymptomatic; start PO ferrous sulfate BID with stool softeners  -Immunization status: all immunizations up to date  Disposition: Continue inpatient postpartum care   LOS: 2 days   Benjaman Kindler, 09/25/2019, 8:45 AM

## 2019-09-25 NOTE — Lactation Note (Signed)
This note was copied from a baby's chart. Lactation Consultation Note  Patient Name: Tammy Townsend Date: 09/25/2019 Reason for consult: Follow-up assessment   LC Student entered the room at the end of the third formula feed of the day. Reassessed parent's feeding choice at this time, and mom stated that she does want to breastfeed baby, that she wants to attempt to breastfeed after the bottle. Educated mom that any breastfeeding attempts need to be done before offering formula to encourage good latching and feeding at the breast.   [facility] student assisted mom with burping and swaddling baby after her feed. [facility] student then proceeded with pumping assistance as requested. Mom was shown how to take apart and put together the pump, how to assess flange size and how to work the pump. An initiation cycle with the pump was completed and mom produced enough colostrum to have some collected in the valve. Mom was re-educated about hand expressing, and colostrum was seen with hand expression.   The [place] student re-educated about supply and demand, regular stimulation of the breasts (emphasized consistency and that stimulation needs to happen at least 8 times a day many times). [facility] student answered questions about sleeping and offering formula at night, as well as confirming that cluster feeding is normal and re-educating about cluster feeding impact on growth of supply. [facility] student encouraged use of skin to skin with partner to soothe baby at night between cluster feeds.   LC encouraged parents to call out with questions or if assistance is needed.       Maternal Data Formula Feeding for Exclusion: No  Feeding Feeding Type: Bottle Fed - Formula Nipple Type: Slow - flow  LATCH Score                   Interventions Interventions: Breast feeding basics reviewed;Breast massage;Hand express;DEBP;Coconut oil  Lactation Tools Discussed/Used     Consult Status Consult Status:  Follow-up Date: 09/25/19 Follow-up type: In-patient    Lavonia Drafts 09/25/2019, 1:45 PM

## 2019-09-26 DIAGNOSIS — D62 Acute posthemorrhagic anemia: Secondary | ICD-10-CM | POA: Diagnosis not present

## 2019-09-26 DIAGNOSIS — Z9851 Tubal ligation status: Secondary | ICD-10-CM

## 2019-09-26 MED ORDER — IBUPROFEN 800 MG PO TABS: 800.0000 mg | ORAL_TABLET | Freq: Four times a day (QID) | ORAL | 0 refills | Status: AC

## 2019-09-26 MED ORDER — FERROUS SULFATE 325 (65 FE) MG PO TABS: 325.0000 mg | ORAL_TABLET | Freq: Two times a day (BID) | ORAL | 3 refills | Status: AC

## 2019-09-26 NOTE — Progress Notes (Signed)
Patient discharged home with infant. Discharge instructions, prescriptions and follow up appointment given to and reviewed with patient. Patient verbalized understanding. Pt wheeled out with infant by auxillary 

## 2019-09-26 NOTE — Progress Notes (Signed)
Subjective: Postpartum Day 3: Cesarean Delivery with BTL Patient reports general soreness, tolerating PO, + flatus, + BM and no problems voiding.    Objective: Vital signs in last 24 hours: Temp:  [98.5 F (36.9 C)-98.6 F (37 C)] 98.5 F (36.9 C) (12/30 2337) Pulse Rate:  [68-75] 75 (12/30 2337) Resp:  [20] 20 (12/30 1541) BP: (124-129)/(65-68) 129/68 (12/30 2337) SpO2:  [98 %-100 %] 98 % (12/30 2337)  Physical Exam:  General: alert and cooperative Lochia: appropriate Uterine Fundus: firm Incision: healing well, steri strips in place DVT Evaluation: No evidence of DVT seen on physical exam.  Recent Labs    09/24/19 0614 09/25/19 0553  HGB 9.3* 9.7*  HCT 28.6* 30.3*    Assessment/Plan: Status post Cesarean section. Doing well postoperatively.  Discharge home with standard precautions and return to clinic in 2 weeks for post op visit  [place] 09/26/2019, 7:45 AM

## 2019-09-26 NOTE — Discharge Instructions (Signed)
Postpartum Care After Cesarean Delivery This sheet gives you information about how to care for yourself from the time you deliver your baby to up to 6-12 weeks after delivery (postpartum period). Your health care provider may also give you more specific instructions. If you have problems or questions, contact your health care provider. Follow these instructions at home: Medicines  Take over-the-counter and prescription medicines only as told by your health care provider.  If you were prescribed an antibiotic medicine, take it as told by your health care provider. Do not stop taking the antibiotic even if you start to feel better.  Ask your health care provider if the medicine prescribed to you: ? Requires you to avoid driving or using heavy machinery. ? Can cause constipation. You may need to take actions to prevent or treat constipation, such as:  Drink enough fluid to keep your urine pale yellow.  Take over-the-counter or prescription medicines.  Eat foods that are high in fiber, such as beans, whole grains, and fresh fruits and vegetables.  Limit foods that are high in fat and processed sugars, such as fried or sweet foods. Activity  Gradually return to your normal activities as told by your health care provider.  Avoid activities that take a lot of effort and energy (are strenuous) until approved by your health care provider. Walking at a slow to moderate pace is usually safe. Ask your health care provider what activities are safe for you. ? Do not lift anything that is heavier than your baby or 10 lb (4.5 kg) as told by your health care provider. ? Do not vacuum, climb stairs, or drive a car for as long as told by your health care provider.  If possible, have someone help you at home until you are able to do your usual activities yourself.  Rest as much as possible. Try to rest or take naps while your baby is sleeping. Vaginal bleeding  It is normal to have vaginal bleeding  (lochia) after delivery. Wear a sanitary pad to absorb vaginal bleeding and discharge. ? During the first week after delivery, the amount and appearance of lochia is often similar to a menstrual period. ? Over the next few weeks, it will gradually decrease to a dry, yellow-brown discharge. ? For most women, lochia stops completely by 4-6 weeks after delivery. Vaginal bleeding can vary from woman to woman.  Change your sanitary pads frequently. Watch for any changes in your flow, such as: ? A sudden increase in volume. ? A change in color. ? Large blood clots.  If you pass a blood clot, save it and call your health care provider to discuss. Do not flush blood clots down the toilet before you get instructions from your health care provider.  Do not use tampons or douches until your health care provider says this is safe.  If you are not breastfeeding, your period should return 6-8 weeks after delivery. If you are breastfeeding, your period may return anytime between 8 weeks after delivery and the time that you stop breastfeeding. Perineal care   If your C-section (Cesarean section) was unplanned, and you were allowed to labor and push before delivery, you may have pain, swelling, and discomfort of the tissue between your vaginal opening and your anus (perineum). You may also have an incision in the tissue (episiotomy) or the tissue may have torn during delivery. Follow these instructions as told by your health care provider: ? Keep your perineum clean and dry as told by   your health care provider. Use medicated pads and pain-relieving sprays and creams as directed. ? If you have an episiotomy or vaginal tear, check the area every day for signs of infection. Check for:  Redness, swelling, or pain.  Fluid or blood.  Warmth.  Pus or a bad smell. ? You may be given a squirt bottle to use instead of wiping to clean the perineum area after you go to the bathroom. As you start healing, you may use  the squirt bottle before wiping yourself. Make sure to wipe gently. ? To relieve pain caused by an episiotomy, vaginal tear, or hemorrhoids, try taking a warm sitz bath 2-3 times a day. A sitz bath is a warm water bath that is taken while you are sitting down. The water should only come up to your hips and should cover your buttocks. Breast care  Within the first few days after delivery, your breasts may feel heavy, full, and uncomfortable (breast engorgement). You may also have milk leaking from your breasts. Your health care provider can suggest ways to help relieve breast discomfort. Breast engorgement should go away within a few days.  If you are breastfeeding: ? Wear a bra that supports your breasts and fits you well. ? Keep your nipples clean and dry. Apply creams and ointments as told by your health care provider. ? You may need to use breast pads to absorb milk leakage. ? You may have uterine contractions every time you breastfeed for several weeks after delivery. Uterine contractions help your uterus return to its normal size. ? If you have any problems with breastfeeding, work with your health care provider or a lactation consultant.  If you are not breastfeeding: ? Avoid touching your breasts as this can make your breasts produce more milk. ? Wear a well-fitting bra and use cold packs to help with swelling. ? Do not squeeze out (express) milk. This causes you to make more milk. Intimacy and sexuality  Ask your health care provider when you can engage in sexual activity. This may depend on your: ? Risk of infection. ? Healing rate. ? Comfort and desire to engage in sexual activity.  You are able to get pregnant after delivery, even if you have not had your period. If desired, talk with your health care provider about methods of family planning or birth control (contraception). Lifestyle  Do not use any products that contain nicotine or tobacco, such as cigarettes, e-cigarettes,  and chewing tobacco. If you need help quitting, ask your health care provider.  Do not drink alcohol, especially if you are breastfeeding. Eating and drinking   Drink enough fluid to keep your urine pale yellow.  Eat high-fiber foods every day. These may help prevent or relieve constipation. High-fiber foods include: ? Whole grain cereals and breads. ? Brown rice. ? Beans. ? Fresh fruits and vegetables.  Take your prenatal vitamins until your postpartum checkup or until your health care provider tells you it is okay to stop. General instructions  Keep all follow-up visits for you and your baby as told by your health care provider. Most women visit their health care provider for a postpartum checkup within the first 3-6 weeks after delivery. Contact a health care provider if you:  Feel unable to cope with the changes that a new baby brings to your life, and these feelings do not go away.  Feel unusually sad or worried.  Have breasts that are painful, hard, or turn red.  Have a fever.    Have trouble holding urine or keeping urine from leaking.  Have little or no interest in activities you used to enjoy.  Have not breastfed at all and you have not had a menstrual period for 12 weeks after delivery.  Have stopped breastfeeding and you have not had a menstrual period for 12 weeks after you stopped breastfeeding.  Have questions about caring for yourself or your baby.  Pass a blood clot from your vagina. Get help right away if you:  Have chest pain.  Have difficulty breathing.  Have sudden, severe leg pain.  Have severe pain or cramping in your abdomen.  Bleed from your vagina so much that you fill more than one sanitary pad in one hour. Bleeding should not be heavier than your heaviest period.  Develop a severe headache.  Faint.  Have blurred vision or spots in your vision.  Have a bad-smelling vaginal discharge.  Have thoughts about hurting yourself or your  baby. If you ever feel like you may hurt yourself or others, or have thoughts about taking your own life, get help right away. You can go to your nearest emergency department or call:  Your local emergency services (911 in the [place]  A suicide crisis helpline, such as the National Suicide Prevention Lifeline at 778-565-44301-210 285 7979. This is open 24 hours a day. Summary  The period of time from when you deliver your baby to up to 6-12 weeks after delivery is called the postpartum period.  Gradually return to your normal activities as told by your health care provider.  Keep all follow-up visits for you and your baby as told by your health care provider. This information is not intended to replace advice given to you by your health care provider. Make sure you discuss any questions you have with your health care provider. Document Revised: 05/02/2018 Document Reviewed: 05/02/2018 Elsevier Patient Education  2020 [employer]    Postpartum Baby Blues The postpartum period begins right after the birth of a baby. During this time, there is often a lot of joy and excitement. It is also a time of many changes in the life of the parents. No matter how many times a mother gives birth, each child brings new challenges to the family, including different ways of relating to one another. It is common to have feelings of excitement along with confusing changes in moods, emotions, and thoughts. You may feel happy one minute and sad or stressed the next. These feelings of sadness usually happen in the period right after you have your baby, and they go away within a week or two. This is called the "baby blues." What are the causes? There is no known cause of baby blues. It is likely caused by a combination of factors. However, changes in hormone levels after childbirth are believed to trigger some of the symptoms. Other factors that can play a role in these mood changes include:  Lack of sleep.  Stressful  life events, such as poverty, caring for a loved one, or death of a loved one.  Genetics. What are the signs or symptoms? Symptoms of this condition include:  Brief changes in mood, such as going from extreme happiness to sadness.  Decreased concentration.  Difficulty sleeping.  Crying spells and tearfulness.  Loss of appetite.  Irritability.  Anxiety. If the symptoms of baby blues last for more than 2 weeks or become more severe, you may have postpartum depression. How is this diagnosed? This condition is diagnosed based on an  evaluation of your symptoms. There are no medical or lab tests that lead to a diagnosis, but there are various questionnaires that a health care provider may use to identify women with the baby blues or postpartum depression. How is this treated? Treatment is not needed for this condition. The baby blues usually go away on their own in 1-2 weeks. Social support is often all that is needed. You will be encouraged to get adequate sleep and rest. Follow these instructions at home: Lifestyle      Get as much rest as you can. Take a nap when the baby sleeps.  Exercise regularly as told by your health care provider. Some women find yoga and walking to be helpful.  Eat a balanced and nourishing diet. This includes plenty of fruits and vegetables, whole grains, and lean proteins.  Do little things that you enjoy. Have a cup of tea, take a bubble bath, read your favorite magazine, or listen to your favorite music.  Avoid alcohol.  Ask for help with household chores, cooking, grocery shopping, or running errands. Do not try to do everything yourself. Consider hiring a postpartum doula to help. This is a professional who specializes in providing support to new mothers.  Try not to make any major life changes during pregnancy or right after giving birth. This can add stress. General instructions  Talk to people close to you about how you are feeling. Get  support from your partner, family members, friends, or other new moms. You may want to join a support group.  Find ways to cope with stress. This may include: ? Writing your thoughts and feelings in a journal. ? Spending time outside. ? Spending time with people who make you laugh.  Try to stay positive in how you think. Think about the things you are grateful for.  Take over-the-counter and prescription medicines only as told by your health care provider.  Let your health care provider know if you have any concerns.  Keep all postpartum visits as told by your health care provider. This is important. Contact a health care provider if:  Your baby blues do not go away after 2 weeks. Get help right away if:  You have thoughts of taking your own life (suicidal thoughts).  You think you may harm the baby or other people.  You see or hear things that are not there (hallucinations). Summary  After giving birth, you may feel happy one minute and sad or stressed the next. Feelings of sadness that happen right after the baby is born and go away after a week or two are called the "baby blues."  You can manage the baby blues by getting enough rest, eating a healthy diet, exercising, spending time with supportive people, and finding ways to cope with stress.  If feelings of sadness and stress last longer than 2 weeks or get in the way of caring for your baby, talk to your health care provider. This may mean you have postpartum depression. This information is not intended to replace advice given to you by your health care provider. Make sure you discuss any questions you have with your health care provider. Document Revised: 01/04/2019 Document Reviewed: 11/08/2016 Elsevier Patient Education  [employer].    Postpartum Hypertension Postpartum hypertension is high blood pressure that remains higher than normal after childbirth. You may not realize that you have postpartum hypertension  if your blood pressure is not being checked regularly. In most cases, postpartum hypertension will go  away on its own, usually within a week of delivery. However, for some women, medical treatment is required to prevent serious complications, such as seizures or stroke. What are the causes? This condition may be caused by one or more of the following:  Hypertension that existed before pregnancy (chronic hypertension).  Hypertension that comes on as a result of pregnancy (gestational hypertension).  Hypertensive disorders during pregnancy (preeclampsia) or seizures in women who have high blood pressure during pregnancy (eclampsia).  A condition in which the liver, platelets, and red blood cells are damaged during pregnancy (HELLP syndrome).  A condition in which the thyroid produces too much hormones (hyperthyroidism).  Other rare problems of the nerves (neurological disorders) or blood disorders. In some cases, the cause may not be known. What increases the risk? The following factors may make you more likely to develop this condition:  Chronic hypertension. In some cases, this may not have been diagnosed before pregnancy.  Obesity.  Type 2 diabetes.  Kidney disease.  History of preeclampsia or eclampsia.  Other medical conditions that change the level of hormones in the body (hormonal imbalance). What are the signs or symptoms? As with all types of hypertension, postpartum hypertension may not have any symptoms. Depending on how high your blood pressure is, you may experience:  Headaches. These may be mild, moderate, or severe. They may also be steady, constant, or sudden in onset (thunderclap headache).  Changes in your ability to see (visual changes).  Dizziness.  Shortness of breath.  Swelling of your hands, feet, lower legs, or face. In some cases, you may have swelling in more than one of these locations.  Heart palpitations or a racing heartbeat.  Difficulty  breathing while lying down.  Decrease in the amount of urine that you pass. Other rare signs and symptoms may include:  Sweating more than usual. This lasts longer than a few days after delivery.  Chest pain.  Sudden dizziness when you get up from sitting or lying down.  Seizures.  Nausea or vomiting.  Abdominal pain. How is this diagnosed? This condition may be diagnosed based on the results of a physical exam, blood pressure measurements, and blood and urine tests. You may also have other tests, such as a CT scan or an MRI, to check for other problems of postpartum hypertension. How is this treated? If blood pressure is high enough to require treatment, your options may include:  Medicines to reduce blood pressure (antihypertensives). Tell your health care provider if you are breastfeeding or if you plan to breastfeed. There are many antihypertensive medicines that are safe to take while breastfeeding.  Stopping medicines that may be causing hypertension.  Treating medical conditions that are causing hypertension.  Treating the complications of hypertension, such as seizures, stroke, or kidney problems. Your health care provider will also continue to monitor your blood pressure closely until it is within a safe range for you. Follow these instructions at home:  Take over-the-counter and prescription medicines only as told by your health care provider.  Return to your normal activities as told by your health care provider. Ask your health care provider what activities are safe for you.  Do not use any products that contain nicotine or tobacco, such as cigarettes and e-cigarettes. If you need help quitting, ask your health care provider.  Keep all follow-up visits as told by your health care provider. This is important. Contact a health care provider if:  Your symptoms get worse.  You have new symptoms,  such as: ? A headache that does not get  better. ? Dizziness. ? Visual changes. Get help right away if:  You suddenly develop swelling in your hands, ankles, or face.  You have sudden, rapid weight gain.  You develop difficulty breathing, chest pain, racing heartbeat, or heart palpitations.  You develop severe pain in your abdomen.  You have any symptoms of a stroke. "BE FAST" is an easy way to remember the main warning signs of a stroke: ? B - Balance. Signs are dizziness, sudden trouble walking, or loss of balance. ? E - Eyes. Signs are trouble seeing or a sudden change in vision. ? F - Face. Signs are sudden weakness or numbness of the face, or the face or eyelid drooping on one side. ? A - Arms. Signs are weakness or numbness in an arm. This happens suddenly and usually on one side of the body. ? S - Speech. Signs are sudden trouble speaking, slurred speech, or trouble understanding what people say. ? T - Time. Time to call emergency services. Write down what time symptoms started.  You have other signs of a stroke, such as: ? A sudden, severe headache with no known cause. ? Nausea or vomiting. ? Seizure. These symptoms may represent a serious problem that is an emergency. Do not wait to see if the symptoms will go away. Get medical help right away. Call your local emergency services (911 in the [place] Do not drive yourself to the hospital. Summary  Postpartum hypertension is high blood pressure that remains higher than normal after childbirth.  In most cases, postpartum hypertension will go away on its own, usually within a week of delivery.  For some women, medical treatment is required to prevent serious complications, such as seizures or stroke. This information is not intended to replace advice given to you by your health care provider. Make sure you discuss any questions you have with your health care provider. Document Revised: 10/19/2018 Document Reviewed: 07/03/2017 Elsevier Patient Education  2020 [employer].

## 2019-10-01 ENCOUNTER — Inpatient Hospital Stay: Admission: RE | Admit: 2019-10-01 | Payer: Medicaid Other | Source: Ambulatory Visit

## 2019-10-03 ENCOUNTER — Other Ambulatory Visit: Admission: RE | Admit: 2019-10-03 | Payer: Medicaid Other | Source: Ambulatory Visit

## 2019-10-07 ENCOUNTER — Inpatient Hospital Stay: Admit: 2019-10-07 | Payer: Medicaid Other | Admitting: Obstetrics and Gynecology

## 2019-10-07 SURGERY — Surgical Case
Anesthesia: Choice | Laterality: Bilateral

## 2020-07-26 IMAGING — US ULTRASOUND ABDOMEN LIMITED
1 series · 14 of 25 positions shown · non-contrast
Comparison: None.

CLINICAL DATA: Hyperemesis complicating pregnancy.

EXAM:
ULTRASOUND ABDOMEN LIMITED RIGHT UPPER QUADRANT

[Series 1: ultrasound abdomen limited · 14 of 41 slices shown]
[im 1/41]
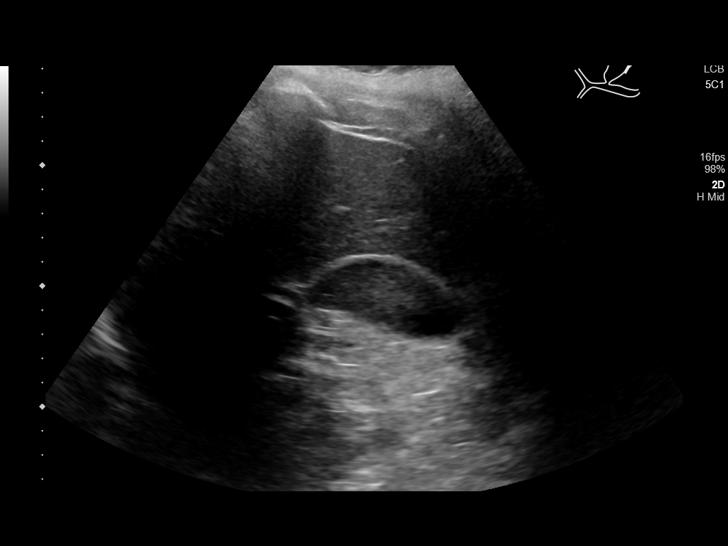
[im 4/41]
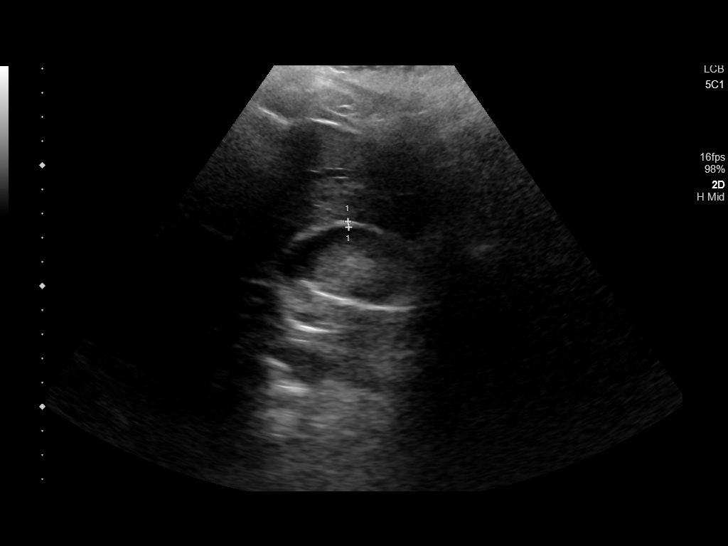
[im 7/41]
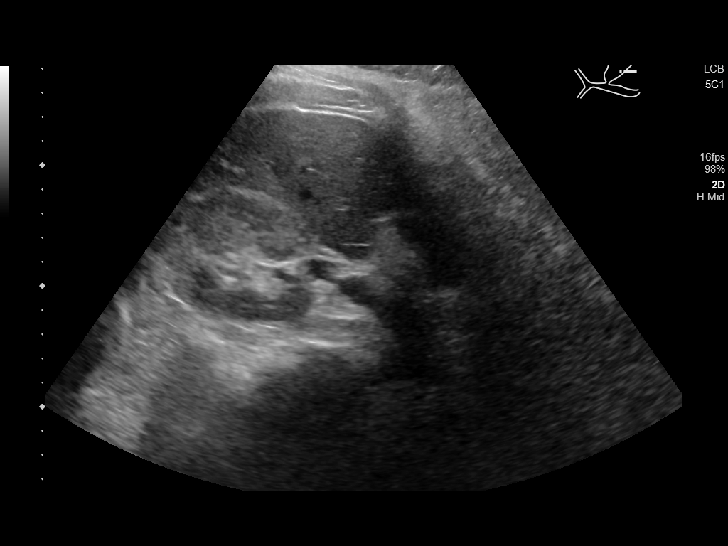
[im 11/41]
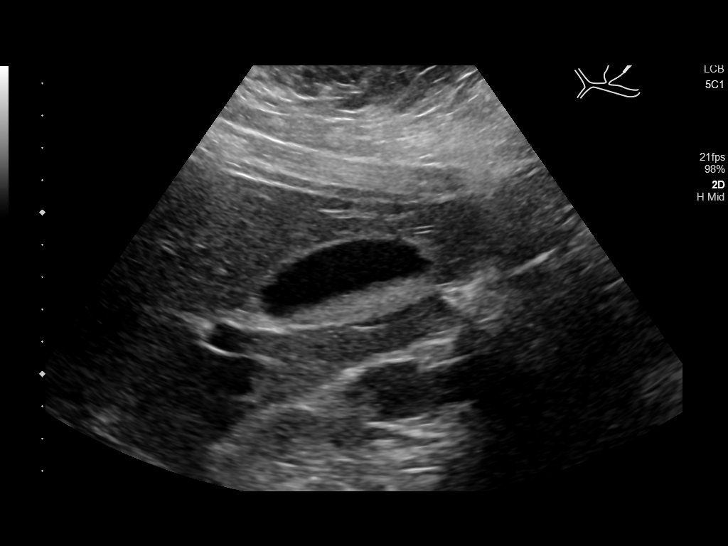
[im 14/41]
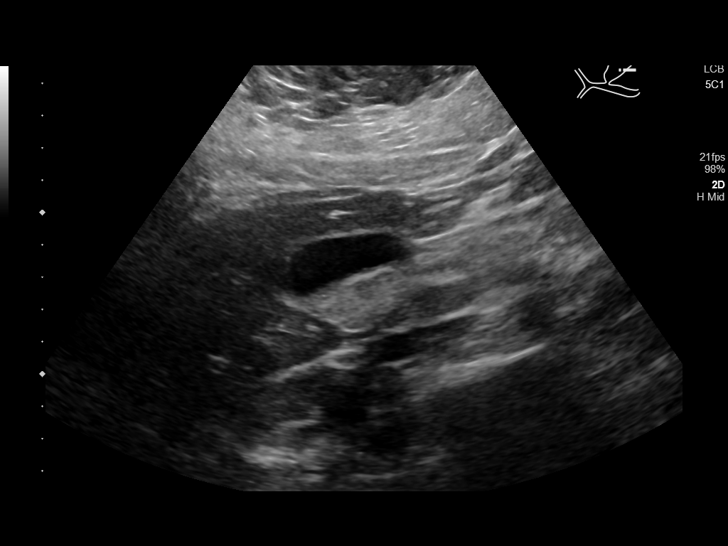
[im 16/41]
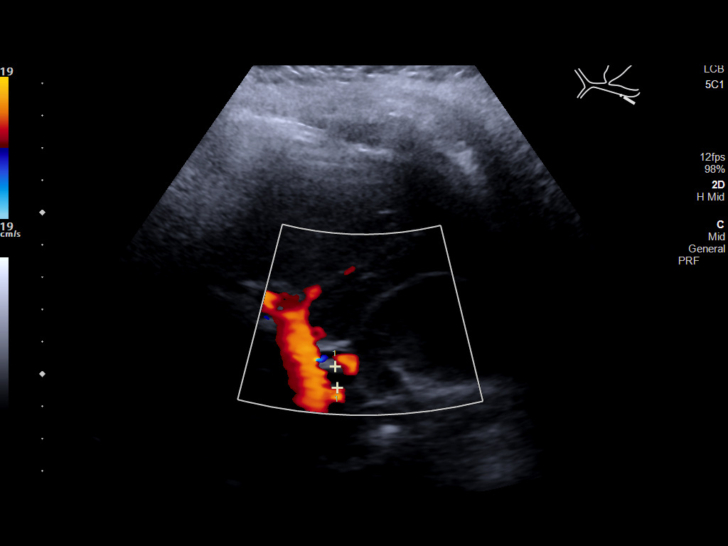
[im 19/41]
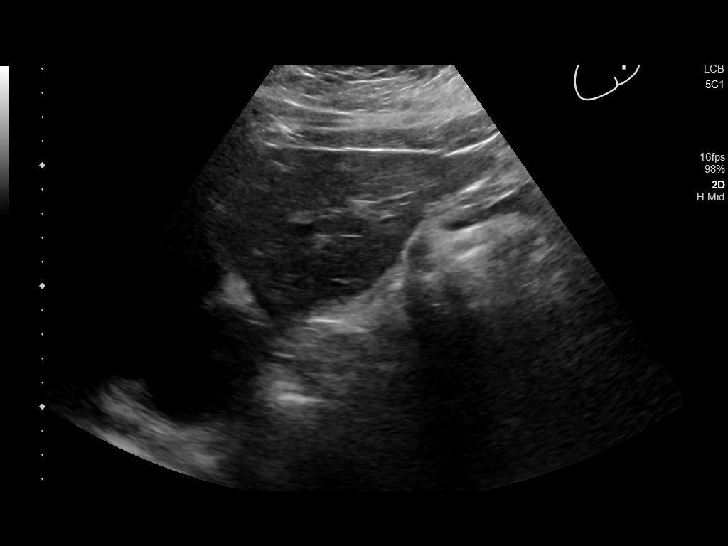
[im 22/41]
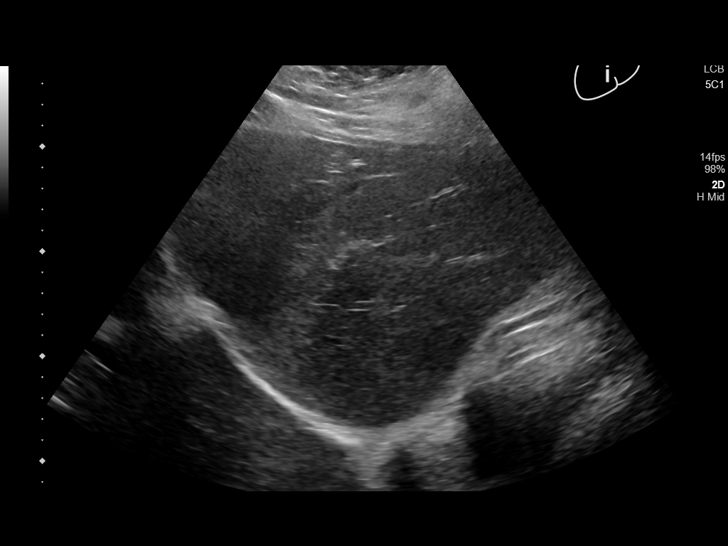
[im 26/41]
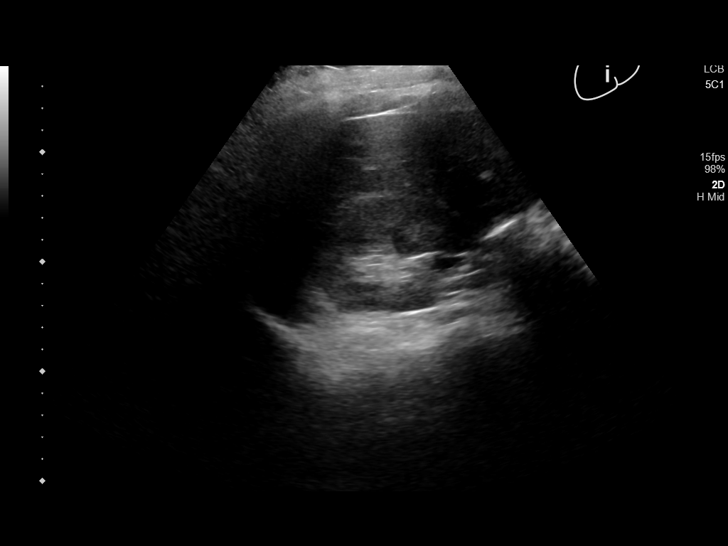
[im 27/41]
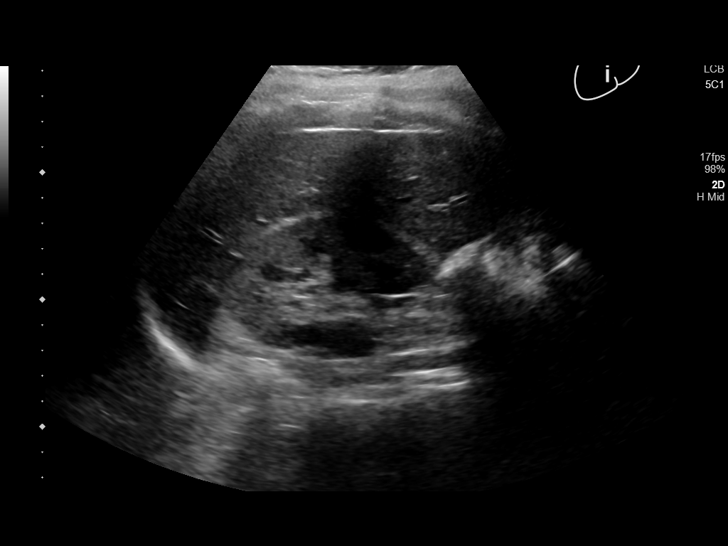
[im 31/41]
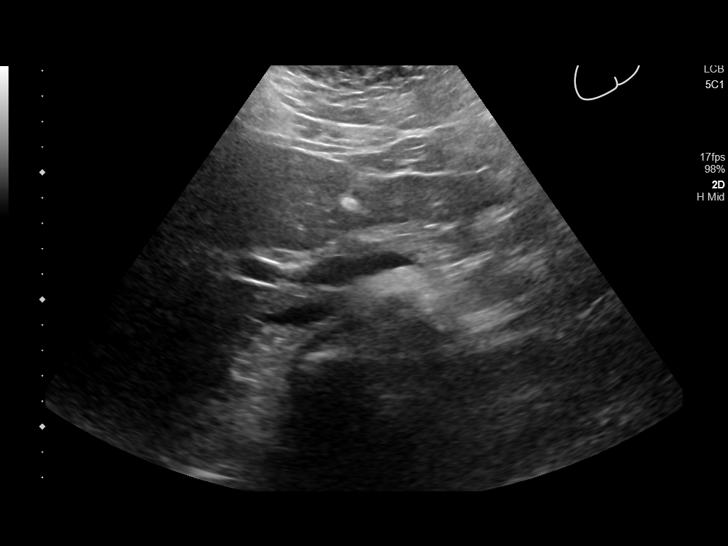
[im 34/41]
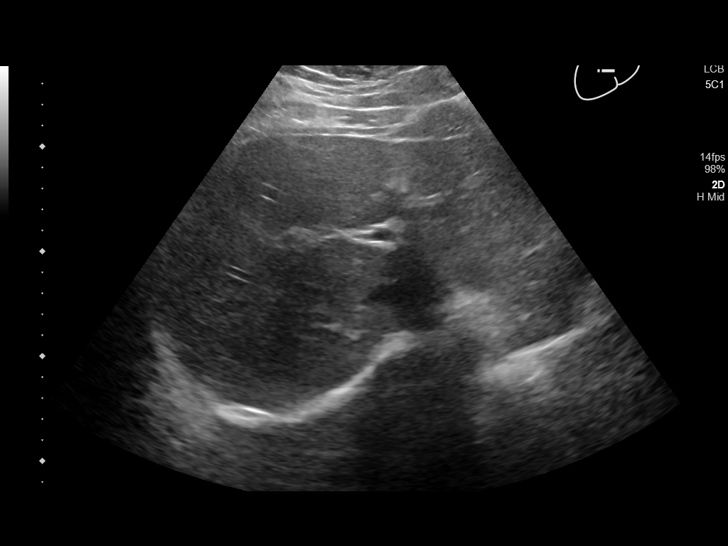
[im 37/41]
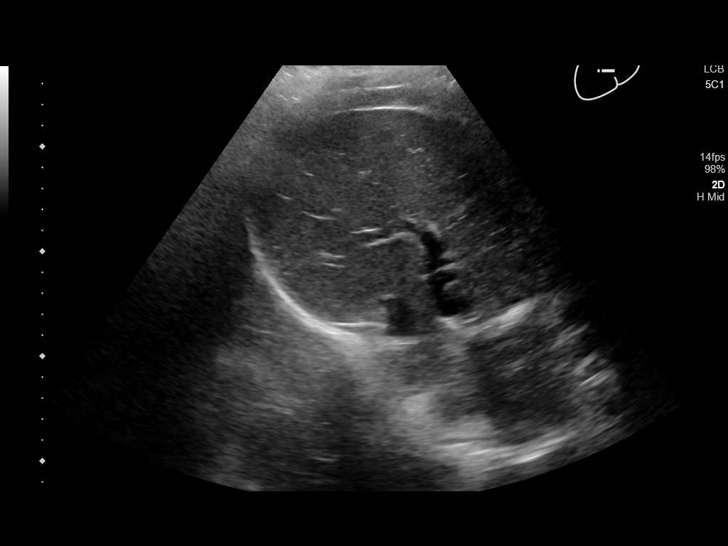
[im 41/41]
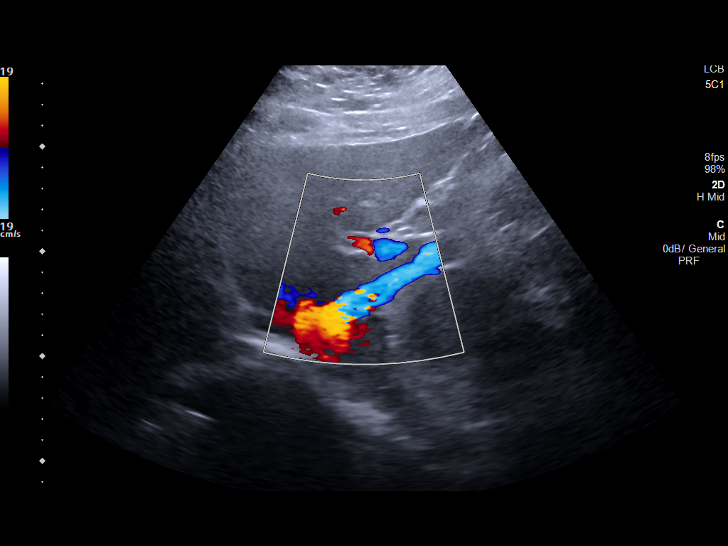

[14 of 25 positions shown; findings below may reference images not displayed]

FINDINGS: Gallbladder:

Gallbladder wall is normal in thickness, 2.4 millimeters. Layering
sludge and small stones are identified within the gallbladder.
Stones are too small to be measured. No sonographic Murphy's sign.

Common bile duct:

Diameter: 6.6 millimeters

Liver:

Liver is mildly heterogeneous without other features of hepatic
steatosis. No focal liver lesions.

Portal vein is patent on color Doppler imaging with normal direction
of blood flow towards the liver.
IMPRESSION: Layering stones and sludge within the gallbladder. Mildly prominent
common bile duct. Findings are consistent with biliary stasis.

No evidence for acute cholecystitis.
